# Patient Record
Sex: Male | Born: 1955 | Race: White | Hispanic: No | Marital: Married | Smoking: Current every day smoker
Health system: Southern US, Community
[De-identification: ages and names within clinical notes are randomized; demographics above are authoritative.]

## PROBLEM LIST (undated history)

## (undated) DIAGNOSIS — B1920 Unspecified viral hepatitis C without hepatic coma: Secondary | ICD-10-CM

## (undated) DIAGNOSIS — M545 Low back pain, unspecified: Secondary | ICD-10-CM

## (undated) DIAGNOSIS — K219 Gastro-esophageal reflux disease without esophagitis: Secondary | ICD-10-CM

## (undated) DIAGNOSIS — K769 Liver disease, unspecified: Principal | ICD-10-CM

## (undated) DIAGNOSIS — K7460 Unspecified cirrhosis of liver: Secondary | ICD-10-CM

## (undated) DIAGNOSIS — F419 Anxiety disorder, unspecified: Secondary | ICD-10-CM

## (undated) DIAGNOSIS — C220 Liver cell carcinoma: Secondary | ICD-10-CM

## (undated) DIAGNOSIS — I10 Essential (primary) hypertension: Secondary | ICD-10-CM

## (undated) DIAGNOSIS — D696 Thrombocytopenia, unspecified: Secondary | ICD-10-CM

## (undated) HISTORY — DX: Unspecified viral hepatitis C without hepatic coma: B19.20

## (undated) HISTORY — DX: Gastro-esophageal reflux disease without esophagitis: K21.9

## (undated) HISTORY — DX: Liver cell carcinoma: C22.0

## (undated) HISTORY — DX: Low back pain, unspecified: M54.50

## (undated) HISTORY — DX: Thrombocytopenia, unspecified: D69.6

## (undated) HISTORY — DX: Essential (primary) hypertension: I10

## (undated) HISTORY — DX: Liver disease, unspecified: K76.9

## (undated) HISTORY — DX: Anxiety disorder, unspecified: F41.9

## (undated) HISTORY — DX: Unspecified cirrhosis of liver: K74.60

## (undated) HISTORY — DX: Low back pain: M54.5

---

## 2002-02-15 ENCOUNTER — Emergency Department (HOSPITAL_COMMUNITY): Admission: EM | Admit: 2002-02-15 | Discharge: 2002-02-15 | Payer: Self-pay | Admitting: *Deleted

## 2003-10-30 ENCOUNTER — Emergency Department (HOSPITAL_COMMUNITY): Admission: EM | Admit: 2003-10-30 | Discharge: 2003-10-30 | Payer: Self-pay | Admitting: Emergency Medicine

## 2004-05-25 ENCOUNTER — Ambulatory Visit (HOSPITAL_COMMUNITY): Admission: RE | Admit: 2004-05-25 | Discharge: 2004-05-25 | Payer: Self-pay | Admitting: Family Medicine

## 2007-04-26 ENCOUNTER — Ambulatory Visit (HOSPITAL_COMMUNITY): Admission: RE | Admit: 2007-04-26 | Discharge: 2007-04-26 | Payer: Self-pay | Admitting: Family Medicine

## 2008-01-17 ENCOUNTER — Ambulatory Visit: Payer: Self-pay | Admitting: Gastroenterology

## 2011-05-17 ENCOUNTER — Other Ambulatory Visit (HOSPITAL_COMMUNITY): Payer: Self-pay | Admitting: Family Medicine

## 2011-05-17 DIAGNOSIS — G8929 Other chronic pain: Secondary | ICD-10-CM

## 2011-05-17 DIAGNOSIS — M545 Low back pain: Secondary | ICD-10-CM

## 2011-05-18 ENCOUNTER — Ambulatory Visit (HOSPITAL_COMMUNITY)
Admission: RE | Admit: 2011-05-18 | Discharge: 2011-05-18 | Disposition: A | Discharge status: Home / Self Care | Payer: Self-pay | Source: Ambulatory Visit | Attending: Family Medicine | Admitting: Family Medicine

## 2011-05-18 DIAGNOSIS — M5137 Other intervertebral disc degeneration, lumbosacral region: Secondary | ICD-10-CM | POA: Insufficient documentation

## 2011-05-18 DIAGNOSIS — M51379 Other intervertebral disc degeneration, lumbosacral region without mention of lumbar back pain or lower extremity pain: Secondary | ICD-10-CM | POA: Insufficient documentation

## 2011-05-18 DIAGNOSIS — M25559 Pain in unspecified hip: Secondary | ICD-10-CM | POA: Insufficient documentation

## 2011-05-18 DIAGNOSIS — M545 Low back pain, unspecified: Secondary | ICD-10-CM | POA: Insufficient documentation

## 2011-05-18 DIAGNOSIS — G8929 Other chronic pain: Secondary | ICD-10-CM

## 2011-05-18 DIAGNOSIS — M5126 Other intervertebral disc displacement, lumbar region: Secondary | ICD-10-CM | POA: Insufficient documentation

## 2011-05-30 ENCOUNTER — Other Ambulatory Visit (HOSPITAL_COMMUNITY): Payer: Self-pay | Admitting: Oncology

## 2011-05-30 ENCOUNTER — Ambulatory Visit (HOSPITAL_COMMUNITY)
Admission: RE | Admit: 2011-05-30 | Discharge: 2011-05-30 | Disposition: A | Discharge status: Home / Self Care | Payer: Self-pay | Source: Ambulatory Visit | Attending: Oncology | Admitting: Oncology

## 2011-05-30 ENCOUNTER — Encounter (HOSPITAL_COMMUNITY): Payer: Self-pay | Attending: Oncology | Admitting: Oncology

## 2011-05-30 DIAGNOSIS — K7689 Other specified diseases of liver: Secondary | ICD-10-CM | POA: Insufficient documentation

## 2011-05-30 DIAGNOSIS — K7460 Unspecified cirrhosis of liver: Secondary | ICD-10-CM

## 2011-05-30 DIAGNOSIS — D696 Thrombocytopenia, unspecified: Secondary | ICD-10-CM

## 2011-05-30 DIAGNOSIS — D72819 Decreased white blood cell count, unspecified: Secondary | ICD-10-CM

## 2011-05-30 DIAGNOSIS — B1920 Unspecified viral hepatitis C without hepatic coma: Secondary | ICD-10-CM

## 2011-05-30 DIAGNOSIS — R5381 Other malaise: Secondary | ICD-10-CM | POA: Insufficient documentation

## 2011-05-30 DIAGNOSIS — R161 Splenomegaly, not elsewhere classified: Secondary | ICD-10-CM | POA: Insufficient documentation

## 2011-05-30 DIAGNOSIS — R599 Enlarged lymph nodes, unspecified: Secondary | ICD-10-CM | POA: Insufficient documentation

## 2011-05-30 DIAGNOSIS — R5383 Other fatigue: Secondary | ICD-10-CM | POA: Insufficient documentation

## 2011-05-30 LAB — COMPREHENSIVE METABOLIC PANEL
ALT: 63 U/L — ABNORMAL HIGH (ref 0–53)
AST: 58 U/L — ABNORMAL HIGH (ref 0–37)
Albumin: 4.3 g/dL (ref 3.5–5.2)
Alkaline Phosphatase: 105 U/L (ref 39–117)
BUN: 9 mg/dL (ref 6–23)
CO2: 34 mEq/L — ABNORMAL HIGH (ref 19–32)
Calcium: 10.1 mg/dL (ref 8.4–10.5)
Chloride: 101 mEq/L (ref 96–112)
Creatinine, Ser: 0.59 mg/dL (ref 0.4–1.5)
GFR calc Af Amer: >60 mL/min (ref >60)
GFR calc non Af Amer: >60 mL/min (ref >60)
Glucose, Bld: 104 mg/dL — ABNORMAL HIGH (ref 70–99)
Potassium: 4.6 mEq/L (ref 3.5–5.1)
Sodium: 140 mEq/L (ref 135–145)
Total Bilirubin: 0.9 mg/dL (ref 0.3–1.2)
Total Protein: 7.4 g/dL (ref 6.0–8.3)

## 2011-05-30 LAB — CBC
HCT: 42.6 % (ref 39.0–52.0)
Hemoglobin: 15.3 g/dL (ref 13.0–17.0)
MCH: 33.2 pg (ref 26.0–34.0)
MCHC: 35.9 g/dL (ref 30.0–36.0)
MCV: 92.4 fL (ref 78.0–100.0)
Platelets: 43 10*3/uL — ABNORMAL LOW (ref 150–400)
RBC: 4.61 MIL/uL (ref 4.22–5.81)
RDW: 13.7 % (ref 11.5–15.5)
WBC: 3.6 10*3/uL — ABNORMAL LOW (ref 4.0–10.5)

## 2011-05-30 LAB — DIFFERENTIAL
Basophils Absolute: 0 10*3/uL (ref 0.0–0.1)
Basophils Relative: 0 % (ref 0–1)
Eosinophils Absolute: 0 10*3/uL (ref 0.0–0.7)
Eosinophils Relative: 1 % (ref 0–5)
Lymphocytes Relative: 14 % (ref 12–46)
Lymphs Abs: 0.5 10*3/uL — ABNORMAL LOW (ref 0.7–4.0)
Monocytes Absolute: 0.3 10*3/uL (ref 0.1–1.0)
Monocytes Relative: 8 % (ref 3–12)
Neutro Abs: 2.8 10*3/uL (ref 1.7–7.7)
Neutrophils Relative %: 77 % (ref 43–77)

## 2011-05-30 LAB — FOLATE: Folate: >20 ng/mL

## 2011-05-30 LAB — HIV ANTIBODY (ROUTINE TESTING W REFLEX): HIV: NONREACTIVE

## 2011-05-30 LAB — VITAMIN B12: Vitamin B-12: 1064 pg/mL — ABNORMAL HIGH (ref 211–911)

## 2011-05-30 LAB — LACTATE DEHYDROGENASE: LDH: 162 U/L (ref 94–250)

## 2011-05-30 MED ORDER — IOHEXOL 300 MG/ML  SOLN
50.0000 mL | Freq: Once | INTRAMUSCULAR | Status: AC | PRN
  Administered 2011-05-30: 50 mL via INTRAVENOUS

## 2011-05-30 MED ORDER — IOHEXOL 300 MG/ML  SOLN
100.0000 mL | Freq: Once | INTRAMUSCULAR | Status: AC | PRN
  Administered 2011-05-30: 100 mL via INTRAVENOUS

## 2011-05-31 LAB — HEPATITIS PANEL, ACUTE
HCV Ab: REACTIVE — AB
Hep A IgM: NEGATIVE
Hep B C IgM: NEGATIVE
Hepatitis B Surface Ag: NEGATIVE

## 2011-06-06 ENCOUNTER — Other Ambulatory Visit (HOSPITAL_COMMUNITY): Payer: Self-pay | Admitting: Oncology

## 2011-06-06 DIAGNOSIS — K769 Liver disease, unspecified: Secondary | ICD-10-CM

## 2011-06-08 ENCOUNTER — Encounter (INDEPENDENT_AMBULATORY_CARE_PROVIDER_SITE_OTHER): Payer: Self-pay | Admitting: Vascular Surgery

## 2011-06-08 ENCOUNTER — Encounter (INDEPENDENT_AMBULATORY_CARE_PROVIDER_SITE_OTHER): Payer: Self-pay

## 2011-06-08 DIAGNOSIS — M79609 Pain in unspecified limb: Secondary | ICD-10-CM

## 2011-06-08 DIAGNOSIS — I70219 Atherosclerosis of native arteries of extremities with intermittent claudication, unspecified extremity: Secondary | ICD-10-CM

## 2011-06-09 NOTE — Consult Note (Signed)
VASCULAR SURGERY CONSULTATION  Andrew White, Andrew White DOB:  Dec 13, 1956                                       06/08/2011 CHART#:[id number]  I saw this patient in the  office today concerning his bilateral lower extremity pain.  He was referred by Dr. Phillips Odor.  This a pleasant, 55- year-old gentleman who noted the gradual onset of bilateral leg pain approximately a year ago.  He experiences pain in both calves which is brought on by ambulation and relieved with rest.  This occurs at approximately 50 yards.  Over the last year, his symptoms have remained relatively stable.  There are no other aggravating or alleviating factors.  He does get some cramps at night but has had no rest pain.  He had no history of nonhealing wounds.  PAST MEDICAL HISTORY:  Significant for hypertension.  He denies any history of diabetes, hypercholesterolemia, history of previous myocardial infarction, or history of congestive heart failure.  He does have history of hepatitis C and also lumbar disk disease and is followed by Dr. Jeral Fruit with L4-5 disease.  SOCIAL HISTORY:  He is married.  He has 2 children.  He smokes a pack per day of cigarettes and has been smoking for 35-40 years.  He does not drink alcohol on a regular basis.  FAMILY HISTORY:  Has a brother who had PTCA at a young age.  He is unaware of any other history of premature cardiovascular disease.  REVIEW OF SYSTEMS:  GENERAL:  He has had some weight loss, he is 5 feet 11 inches, 136 pounds.  He had some loss of appetite.  CARDIOVASCULAR: He had no chest pain, chest pressure, palpitations or arrhythmias.  He does admit to dyspnea on exertion.  He had no history of stroke or TIA. He had no history of DVT or phlebitis. NEUROLOGIC:  He has occasional headaches. ENT:  He has had some gradual loss of hearing. MUSCULOSKELETAL:  He has had joint pain and muscle pain. PSYCHIATRIC:  He has had depression and anxiety. GI, PULMONARY, GU,  INTEGUMENTARY:  Unremarkable and documented on the medical history of his chart. HEMATOLOGIC:  He does have a history of thrombocytopenia.  PHYSICAL EXAMINATION:  This is a pleasant 55 year old gentleman who appears his stated age.  Blood pressure is 109/69, heart rate is 68, saturation 98%.  HEENT:  Unremarkable.  Lungs:  Clear bilaterally to auscultation without rales, rhonchi or wheezing.  Cardiovascular:  I do not detect any carotid bruits.  He has a regular rate and rhythm.  He has diminished palpable femoral pulses bilaterally.  I cannot palpate popliteal or pedal pulses on either side.  He has acrocyanosis bilaterally.  He does not have any significant lower extremity swelling. Abdomen:  Soft and nontender with normal pitched bowel sounds.  I do not appreciate an aneurysm.  Musculoskeletal:  There are no major deformities.  Neurologic:  He has no focal weakness or paresthesias. Skin:  There are no ulcers or rashes.  I have independently interpreted his arterial Doppler study in our office today which shows monophasic Doppler signals in the right foot with biphasic Doppler signals on the left.  He has an ankle brachial index of 75% on the right and 77% on the left.  Based on his exam, it is difficult to determine if he may have some proximal aortoiliac disease or if his  disease is more infrainguinal. The femoral pulses do feel diminished.  However, given that his ABIs appear reasonable,  I suspect that he does not have multilevel disease. Regardless  currently his symptoms are quite tolerable and have been stable over the last year.  I have explained that typically we would not recommend arteriography unless he developed disabling claudication, rest pain, or nonhealing ulcer.  We have had a long discussion about the importance of tobacco cessation and I have given the number for [facility] tobacco cessation program.  I discussed conservative treatment which includes tobacco  cessation and a structured walking program.  I have also reviewed his records that were sent with him and he apparently is scheduled for a liver biopsy in the near future.  He does have a history of chronic thrombocytopenia.  I will see him back in 6 months and I have ordered follow-up ABIs for that time.  He knows to call sooner if he has problems.    Di Kindle. Edilia Bo, M.D. Electronically Signed CSD/MEDQ  D:  06/08/2011  T:  06/09/2011  Job:  [id number]  cc:   Corrie Mckusick, M.D. Hilda Lias, M.D.

## 2011-06-13 ENCOUNTER — Encounter (HOSPITAL_COMMUNITY): Payer: Self-pay | Admitting: Oncology

## 2011-06-13 ENCOUNTER — Other Ambulatory Visit (HOSPITAL_COMMUNITY): Payer: Self-pay | Admitting: Oncology

## 2011-06-13 DIAGNOSIS — K7689 Other specified diseases of liver: Secondary | ICD-10-CM

## 2011-06-13 DIAGNOSIS — D696 Thrombocytopenia, unspecified: Secondary | ICD-10-CM

## 2011-06-13 DIAGNOSIS — D72819 Decreased white blood cell count, unspecified: Secondary | ICD-10-CM

## 2011-06-13 LAB — COMPREHENSIVE METABOLIC PANEL
Albumin: 4.1 g/dL (ref 3.5–5.2)
BUN: 9 mg/dL (ref 6–23)
Calcium: 9.8 mg/dL (ref 8.4–10.5)
Chloride: 105 mEq/L (ref 96–112)
Creatinine, Ser: 0.63 mg/dL (ref 0.50–1.35)
GFR calc non Af Amer: >60 mL/min (ref >60)
Total Bilirubin: 1.3 mg/dL — ABNORMAL HIGH (ref 0.3–1.2)

## 2011-06-13 LAB — DIFFERENTIAL
Basophils Absolute: 0 10*3/uL (ref 0.0–0.1)
Basophils Relative: 0 % (ref 0–1)
Eosinophils Absolute: 0 10*3/uL (ref 0.0–0.7)
Eosinophils Relative: 1 % (ref 0–5)
Monocytes Absolute: 0.2 10*3/uL (ref 0.1–1.0)
Monocytes Relative: 7 % (ref 3–12)
Neutro Abs: 2.7 10*3/uL (ref 1.7–7.7)

## 2011-06-13 LAB — CBC
HCT: 41 % (ref 39.0–52.0)
Hemoglobin: 15.1 g/dL (ref 13.0–17.0)
MCH: 33.9 pg (ref 26.0–34.0)
MCHC: 36.8 g/dL — ABNORMAL HIGH (ref 30.0–36.0)
MCV: 92.1 fL (ref 78.0–100.0)
RDW: 13.8 % (ref 11.5–15.5)

## 2011-06-14 LAB — AFP TUMOR MARKER: AFP-Tumor Marker: 3 ng/mL (ref 0.0–8.0)

## 2011-06-17 DIAGNOSIS — I70219 Atherosclerosis of native arteries of extremities with intermittent claudication, unspecified extremity: Secondary | ICD-10-CM

## 2011-06-21 ENCOUNTER — Ambulatory Visit (HOSPITAL_COMMUNITY)
Admission: RE | Admit: 2011-06-21 | Discharge: 2011-06-21 | Disposition: A | Discharge status: Home / Self Care | Payer: Self-pay | Source: Ambulatory Visit | Attending: Oncology | Admitting: Oncology

## 2011-06-21 ENCOUNTER — Other Ambulatory Visit (HOSPITAL_COMMUNITY): Payer: Self-pay | Admitting: Oncology

## 2011-06-21 ENCOUNTER — Other Ambulatory Visit (HOSPITAL_COMMUNITY): Payer: Self-pay

## 2011-06-21 DIAGNOSIS — K769 Liver disease, unspecified: Secondary | ICD-10-CM

## 2011-06-21 DIAGNOSIS — Z5309 Procedure and treatment not carried out because of other contraindication: Secondary | ICD-10-CM | POA: Insufficient documentation

## 2011-06-21 DIAGNOSIS — D696 Thrombocytopenia, unspecified: Secondary | ICD-10-CM | POA: Insufficient documentation

## 2011-06-21 DIAGNOSIS — Z01812 Encounter for preprocedural laboratory examination: Secondary | ICD-10-CM | POA: Insufficient documentation

## 2011-06-21 DIAGNOSIS — K7689 Other specified diseases of liver: Secondary | ICD-10-CM | POA: Insufficient documentation

## 2011-06-21 LAB — CBC
MCH: 34.1 pg — ABNORMAL HIGH (ref 26.0–34.0)
MCV: 90.2 fL (ref 78.0–100.0)
Platelets: 31 10*3/uL — ABNORMAL LOW (ref 150–400)
RDW: 13.8 % (ref 11.5–15.5)

## 2011-06-21 LAB — PROTIME-INR: Prothrombin Time: 17.2 seconds — ABNORMAL HIGH (ref 11.6–15.2)

## 2011-06-22 ENCOUNTER — Other Ambulatory Visit (HOSPITAL_COMMUNITY): Payer: Self-pay | Admitting: Oncology

## 2011-06-22 DIAGNOSIS — K769 Liver disease, unspecified: Secondary | ICD-10-CM

## 2011-06-24 ENCOUNTER — Other Ambulatory Visit (HOSPITAL_COMMUNITY): Payer: Self-pay | Admitting: *Deleted

## 2011-06-24 ENCOUNTER — Ambulatory Visit (HOSPITAL_COMMUNITY)
Admission: RE | Admit: 2011-06-24 | Discharge: 2011-06-24 | Disposition: A | Discharge status: Home / Self Care | Payer: Self-pay | Source: Ambulatory Visit | Attending: Diagnostic Radiology | Admitting: Diagnostic Radiology

## 2011-06-24 DIAGNOSIS — M25559 Pain in unspecified hip: Secondary | ICD-10-CM | POA: Insufficient documentation

## 2011-06-24 DIAGNOSIS — M545 Low back pain, unspecified: Secondary | ICD-10-CM | POA: Insufficient documentation

## 2011-06-24 DIAGNOSIS — M549 Dorsalgia, unspecified: Secondary | ICD-10-CM

## 2011-06-24 DIAGNOSIS — M546 Pain in thoracic spine: Secondary | ICD-10-CM | POA: Insufficient documentation

## 2011-07-02 ENCOUNTER — Other Ambulatory Visit (HOSPITAL_COMMUNITY): Payer: Self-pay | Admitting: Oncology

## 2011-07-02 ENCOUNTER — Encounter (HOSPITAL_COMMUNITY): Payer: Self-pay | Admitting: Oncology

## 2011-07-02 DIAGNOSIS — K769 Liver disease, unspecified: Secondary | ICD-10-CM | POA: Insufficient documentation

## 2011-07-02 DIAGNOSIS — K7460 Unspecified cirrhosis of liver: Secondary | ICD-10-CM | POA: Insufficient documentation

## 2011-07-02 DIAGNOSIS — B1920 Unspecified viral hepatitis C without hepatic coma: Secondary | ICD-10-CM

## 2011-07-02 HISTORY — DX: Liver disease, unspecified: K76.9

## 2011-07-02 HISTORY — DX: Unspecified viral hepatitis C without hepatic coma: B19.20

## 2011-07-02 HISTORY — DX: Unspecified cirrhosis of liver: K74.60

## 2011-07-04 ENCOUNTER — Other Ambulatory Visit: Payer: Self-pay | Admitting: Interventional Radiology

## 2011-07-04 ENCOUNTER — Ambulatory Visit (HOSPITAL_COMMUNITY)
Admission: RE | Admit: 2011-07-04 | Discharge: 2011-07-04 | Disposition: A | Discharge status: Home / Self Care | Payer: Self-pay | Source: Ambulatory Visit | Attending: Oncology | Admitting: Oncology

## 2011-07-04 DIAGNOSIS — K7689 Other specified diseases of liver: Secondary | ICD-10-CM | POA: Insufficient documentation

## 2011-07-04 DIAGNOSIS — K769 Liver disease, unspecified: Secondary | ICD-10-CM

## 2011-07-04 DIAGNOSIS — K7460 Unspecified cirrhosis of liver: Secondary | ICD-10-CM | POA: Insufficient documentation

## 2011-07-04 HISTORY — PX: LIVER BIOPSY: SHX301

## 2011-07-04 LAB — CBC
HCT: 37.6 % — ABNORMAL LOW (ref 39.0–52.0)
Hemoglobin: 14.1 g/dL (ref 13.0–17.0)
MCV: 91.9 fL (ref 78.0–100.0)
RDW: 14.6 % (ref 11.5–15.5)
WBC: 2.4 10*3/uL — ABNORMAL LOW (ref 4.0–10.5)

## 2011-07-04 LAB — TYPE AND SCREEN

## 2011-07-05 ENCOUNTER — Ambulatory Visit (HOSPITAL_COMMUNITY): Payer: Self-pay | Admitting: Oncology

## 2011-07-05 LAB — PREPARE PLATELET PHERESIS
Unit division: 0
Unit division: 0

## 2011-07-08 ENCOUNTER — Other Ambulatory Visit (HOSPITAL_COMMUNITY): Payer: Self-pay | Admitting: Oncology

## 2011-07-08 ENCOUNTER — Encounter (HOSPITAL_COMMUNITY): Payer: Self-pay | Attending: Oncology | Admitting: Oncology

## 2011-07-08 ENCOUNTER — Encounter (HOSPITAL_COMMUNITY): Payer: Self-pay | Admitting: Oncology

## 2011-07-08 DIAGNOSIS — B1920 Unspecified viral hepatitis C without hepatic coma: Secondary | ICD-10-CM

## 2011-07-08 DIAGNOSIS — C228 Malignant neoplasm of liver, primary, unspecified as to type: Secondary | ICD-10-CM

## 2011-07-08 DIAGNOSIS — C220 Liver cell carcinoma: Secondary | ICD-10-CM | POA: Insufficient documentation

## 2011-07-08 DIAGNOSIS — K7460 Unspecified cirrhosis of liver: Secondary | ICD-10-CM

## 2011-07-08 HISTORY — DX: Liver cell carcinoma: C22.0

## 2011-07-08 LAB — DIFFERENTIAL: Monocytes Absolute: 0.2 10*3/uL (ref 0.1–1.0)

## 2011-07-08 LAB — HEPATIC FUNCTION PANEL
ALT: 52 U/L (ref 0–53)
AST: 47 U/L — ABNORMAL HIGH (ref 0–37)
Albumin: 4.1 g/dL (ref 3.5–5.2)
Alkaline Phosphatase: 89 U/L (ref 39–117)
Total Bilirubin: 0.6 mg/dL (ref 0.3–1.2)

## 2011-07-08 LAB — CBC
HCT: 41.7 % (ref 39.0–52.0)
MCH: 33.6 pg (ref 26.0–34.0)
MCHC: 35.5 g/dL (ref 30.0–36.0)
MCV: 94.6 fL (ref 78.0–100.0)
Platelets: 46 10*3/uL — ABNORMAL LOW (ref 150–400)
RDW: 14.7 % (ref 11.5–15.5)
WBC: 2.7 10*3/uL — ABNORMAL LOW (ref 4.0–10.5)

## 2011-07-08 NOTE — Progress Notes (Signed)
Andrew Ruths, MD [address] [place] [address]  1. Hepatocellular carcinoma  CBC, Differential, Ambulatory referral to Radiation Oncology, Hepatic function panel  2. Hepatitis C  Hepatitis c vrs RNA detect by PCR-qual, CBC, Differential, Hepatic function panel  3. Cirrhosis of liver  CBC, Differential, Hepatic function panel    INTERVAL HISTORY: Andrew White 55 y.o. male returns for followup following an Interventional Radiology biopsy of a liver lesion discovered on CT scan of Abdomen.  The CT revealed two hepatic lesions of the inferior right lobe measuring 2.7 x 2.4 x 2.5 cm medially in inferior right lobe and 2.3 x 2.1 x 2.0 cm laterally in the inferior right lobe.   He underwent this biopsy on 07/04/11.  Biopsy reveals hepatocellular carcinoma, well differentiated.  This of course was surprising and unfortunate news for the patient and his wife.    We spent some time with the patient explaining his diagnosis and his treatment options.  Due to his Hep C and Cirrhosis, the patient is likely not a surgical candidate.  Dr. Mariel Sleet spoke to a radiation oncologist (Dr. Mitzi Hansen).  The patient's case will be discussed at the GI tumor board.  He will be seen by Radiation Oncology in the near future.  The patient was asked to the call the [facility] on Tuesday if he has not heard about a consultation appointment.  The patient denies any complaints today other than being nervous about the results of the biopsy.  He denies any recent weight loss.  He continues to decrease his tobacco dependency and is smoking 1/2 ppd (compared to 1 1/2 ppd).    Past Medical History  Diagnosis Date  . Liver lesion, right lobe 07/02/2011  . Hepatitis C 07/02/2011  . Cirrhosis of liver 07/02/2011  . Low back pain     radiates to right side  . Thrombocytopenia   . Hepatocellular carcinoma 07/08/2011    has Liver lesion, right lobe; Hepatitis C; Cirrhosis of liver; and Hepatocellular  carcinoma on his problem list.      has no known allergies.  Andrew White does not currently have medications on file.  Past Surgical History  Procedure Date  . Liver biopsy 07/04/11    Done at [facility]    Denies any headaches, dizziness, double vision, fevers, chills, night sweats, nausea, vomiting, diarrhea, constipation, chest pain, heart palpitations, shortness of breath, blood in stool, black tarry stool, urinary pain, urinary burning, urinary frequency, hematuria.   PHYSICAL EXAMINATION  Filed Vitals:   07/08/11 0919  BP: 97/66  Pulse: 79  Temp: 97.7 F (36.5 C)    GENERAL:alert, no distress and skinny SKIN: skin color, texture, turgor are normal, no rashes or significant lesions HEAD: Normocephalic, No masses, lesions, tenderness or abnormalities EYES: normal EARS: External ears normal NECK: trachea midline LYMPH:  not examined BREAST:not examined LUNGS: clear to auscultation and percussion, decreased breath sounds HEART: regular rate & rhythm, no murmurs, no gallops, S1 normal and S2 normal ABDOMEN:abdomen soft, non-tender and normal bowel sounds BACK: Back symmetric, no curvature., No CVA tenderness EXTREMITIES:less then 2 second capillary refill, no joint deformities, effusion, or inflammation, no edema, no skin discoloration  NEURO: alert & oriented x 3 with fluent speech, no focal motor/sensory deficits, gait normal   LABORATORY DATA: Lab Results  Component Value Date   WBC 2.7* 07/08/2011   HGB 14.8 07/08/2011   HCT 41.7 07/08/2011   MCV 94.6 07/08/2011   PLT 46* 07/08/2011  Chemistry      Component Value Date/Time   NA 142 06/13/2011 1040   K 4.4 06/13/2011 1040   CL 105 06/13/2011 1040   CO2 31 06/13/2011 1040   BUN 9 06/13/2011 1040   CREATININE 0.63 06/13/2011 1040      Component Value Date/Time   CALCIUM 9.8 06/13/2011 1040   ALKPHOS 89 07/08/2011 1040   AST 47* 07/08/2011 1040   ALT 52 07/08/2011 1040   BILITOT 0.6 07/08/2011 1040         PENDING LABS: Hep C RNA Viral Titier, CBC diff, Liver Panel   PATHOLOGY:  Hepatocellular carcinoma, well differentiated (biopsy of liver lesion)    ASSESSMENT: 1. Hepatocellular carcinoma, well-differentiated (liver biopsy) 2. Hepatitis C 3. Cirrhosis of liver 4. Tobacco dependency 5. Thrombocytopenia   PLAN:  1. Liver Panel today 2. CBC diff today 3. Hepatitis C RNA viral titer 4. Referral to radiation oncology 5. Continue working on tobacco cessation.  I encouraged the patient to continue decreasing smoking dependency to 1/4 ppd. 6. Patient will return to the clinic in one month's time for follow-up.   All questions were answered. The patient knows to call the clinic with any problems, questions or concerns. We can certainly see the patient much sooner if necessary.  The patient and plan discussed with Glenford Peers, MD and he is in agreement with the aforementioned.  I spent 25 minutes counseling the patient face to face. The total time spent in the appointment was 40 minutes.  Haelie Clapp

## 2011-07-08 NOTE — Patient Instructions (Signed)
[facility]  Discharge Instructions  RECOMMENDATIONS MADE BY THE CONSULTANT AND ANY TEST RESULTS WILL BE SENT TO YOUR REFERRING DOCTOR.   EXAM FINDINGS BY MD TODAY AND SIGNS AND SYMPTOMS TO REPORT TO CLINIC OR PRIMARY GN:FAOZ per Dr Mariel Sleet and Jenita Seashore PA  MEDICATIONS PRESCRIBED: None { INSTRUCTIONS GIVEN AND DISCUSSED: Labs today SPECIAL INSTRUCTIONS/FOLLOW-UP: We will make referral to Dr. Mitzi Hansen. Call us if you do not hear from Radiation appt by Tuesday of next week. [phone] I acknowledge that I have been informed and understand all the instructions given to me and received a copy. I do not have any more questions at this time, but understand that I may call the Specialty Clinic at [facility] at [phone] during business hours should I have any further questions or need assistance in obtaining follow-up care.    __________________________________________  _____________  __________ Signature of Patient or Authorized Representative            Date                   Time    __________________________________________ Nurse's Signature

## 2011-07-12 ENCOUNTER — Telehealth (HOSPITAL_COMMUNITY): Payer: Self-pay | Admitting: *Deleted

## 2011-07-12 NOTE — Telephone Encounter (Signed)
Thank you :)

## 2011-07-12 NOTE — Telephone Encounter (Signed)
The lab cancelled out the Hepatitis C Vrs RNA by PCR-Qual due to insufficient quanity. I will call him to r/s

## 2011-07-13 ENCOUNTER — Ambulatory Visit (HOSPITAL_COMMUNITY): Payer: Self-pay

## 2011-07-14 ENCOUNTER — Encounter (HOSPITAL_BASED_OUTPATIENT_CLINIC_OR_DEPARTMENT_OTHER): Payer: Self-pay

## 2011-07-14 ENCOUNTER — Other Ambulatory Visit (HOSPITAL_COMMUNITY): Payer: Self-pay | Admitting: Oncology

## 2011-07-14 DIAGNOSIS — B1920 Unspecified viral hepatitis C without hepatic coma: Secondary | ICD-10-CM

## 2011-07-14 DIAGNOSIS — C228 Malignant neoplasm of liver, primary, unspecified as to type: Secondary | ICD-10-CM

## 2011-07-14 NOTE — Progress Notes (Signed)
Labs drawn today for HCV by PCR

## 2011-07-20 ENCOUNTER — Ambulatory Visit
Admission: RE | Admit: 2011-07-20 | Discharge: 2011-07-20 | Disposition: A | Discharge status: Home / Self Care | Payer: Self-pay | Source: Ambulatory Visit | Attending: Radiation Oncology | Admitting: Radiation Oncology

## 2011-07-20 DIAGNOSIS — K7460 Unspecified cirrhosis of liver: Secondary | ICD-10-CM | POA: Insufficient documentation

## 2011-07-20 DIAGNOSIS — Z8619 Personal history of other infectious and parasitic diseases: Secondary | ICD-10-CM | POA: Insufficient documentation

## 2011-07-20 DIAGNOSIS — Z79899 Other long term (current) drug therapy: Secondary | ICD-10-CM | POA: Insufficient documentation

## 2011-07-20 DIAGNOSIS — C228 Malignant neoplasm of liver, primary, unspecified as to type: Secondary | ICD-10-CM | POA: Insufficient documentation

## 2011-07-22 ENCOUNTER — Other Ambulatory Visit: Payer: Self-pay | Admitting: Radiation Oncology

## 2011-07-22 DIAGNOSIS — C220 Liver cell carcinoma: Secondary | ICD-10-CM

## 2011-07-25 ENCOUNTER — Ambulatory Visit (INDEPENDENT_AMBULATORY_CARE_PROVIDER_SITE_OTHER): Payer: Self-pay | Admitting: Internal Medicine

## 2011-07-25 ENCOUNTER — Encounter (INDEPENDENT_AMBULATORY_CARE_PROVIDER_SITE_OTHER): Payer: Self-pay | Admitting: Internal Medicine

## 2011-07-25 VITALS — BP 84/50 | HR 84 | Temp 98.0°F | Ht 70.0 in | Wt 130.0 lb

## 2011-07-25 DIAGNOSIS — C220 Liver cell carcinoma: Secondary | ICD-10-CM

## 2011-07-25 DIAGNOSIS — C228 Malignant neoplasm of liver, primary, unspecified as to type: Secondary | ICD-10-CM

## 2011-07-25 DIAGNOSIS — B1920 Unspecified viral hepatitis C without hepatic coma: Secondary | ICD-10-CM

## 2011-07-25 NOTE — Progress Notes (Signed)
Subjective:     Patient ID: Andrew White, male   DOB: Aug 25, 1956, 55 y.o.   MRN: [id number]  HPI Referred by Dr. Mariel Sleet for Hepatitis C/hepatic carcinoma.  He was diagnosed with Hepatitis C 5 yrs ago by the [place]  He was tested due to low platelets  His risk factors for Hepatitis C as far as he know is  tatoos. He had tatoos yrs ago (home tatoos).  No multiple sexual partners. No IV drug use. He has received Hepatitis B vaccines series.   He was not treated for Hepatitis C.  His wife says he basically fell thru the cracks as far as being referred for treatment  He underwent a liver biopsy 07/04/2011 which  revealed hepatolcelluar carcinoma, well differentiated.  He originally saw Dr. Regino Schultze and his WBC ct were low and platelets were low. MRI of the liver is scheduled in the AM.  07/13/2011 AFP 3.0 07/08/2011: total bili 0.6, Direct 0.2, Indirect 0.4, ALP 89, AST 47, ALT 52, Total protein 7.4, Albumin 4.1 WBC low at  2.7, H and H 14.8 and 41.7, MCV 94.6, Platelet low at 46. 06/13/2011: Sodium 142, K 4.4, Chloride 105, C02 31, Glucose 105, BUN 9, Creatinine 0.63, Calcium 9.8, total protein 7.4, Albumin 4.1, AST 46, ALT 50, ALP 74 06/04/2011 PT/INR 15.8 and 1.23 05/30/2011 Folate greater than 20, Vitamin B12 1064  05/30/2011:Hepatitis B Surface Antigen negative. Hepatitis B Core antibody (IgM) negative. Hepatitis A Antibody (IgM) Negative. Hepatitis C antibody reactive, HIV non reactive  06/21/2011 US abdomen: Round lesion along the inferior rt hepatic lobe that is accessible for percutaneous biopsy.  CT abdomen and pelvic with CM 05/30/2011: Cirrhotic appearing liver containing two hypervascular foci in rt lobe with enhancement pattern highly suspicious for multi foci hepatocellular carcinoma.  Significant splenomegaly with several nonspecific low attenuation foci. Perigastric  Collaterals. Extensive atherosclerotic disease. MRI pelvis without contrast 05/18/2011: Small but abnormal amount of  pelvic ascites, etiology uncertain. Sigmoid diverticulosis without active diverticulitis.  Appetite is good . No weight loss. No abdominal pain.  He does have chronic back pain.  No jaundice. BM x 1-2 a day.  No rectal bleeding or melena.  Energy level is good. He denies fever or chills. Review of Systems   See hpi      Objective:   Physical ExamAlert and oriented. Skin warm and dry. Oral mucosa is moist.  Upper dentures and natural lower in fair condition. Sclera anicteric, conjunctivae is pink. Thyroid not enlarged. No cervical lymphadenopathy. Lungs clear. Heart regular rate and rhythm.  Abdomen is soft. Bowel sounds are positive. No hepatomegaly.  He does have splenomegaly. No abdominal masses felt. No tenderness.  No edema to lower extremities. Patient is alert and oriented.    Meld  Score: 8 Assessment:    Hepatitis C and hepatic carcinoma.      Plan:   Further recommendations once we have the MRI report that is scheduled for tomorrow.  Dr. Karilyn Cota was present in the room. Izek may possible be referred for liver transplant pending MRI.

## 2011-07-25 NOTE — Patient Instructions (Signed)
Further recommendations once we have the MRI scheduled for tomorrow.

## 2011-07-26 ENCOUNTER — Ambulatory Visit (HOSPITAL_COMMUNITY)
Admission: RE | Admit: 2011-07-26 | Discharge: 2011-07-26 | Disposition: A | Discharge status: Home / Self Care | Payer: Self-pay | Source: Ambulatory Visit | Attending: Radiation Oncology | Admitting: Radiation Oncology

## 2011-07-26 ENCOUNTER — Encounter (INDEPENDENT_AMBULATORY_CARE_PROVIDER_SITE_OTHER): Payer: Self-pay | Admitting: Internal Medicine

## 2011-07-26 DIAGNOSIS — C228 Malignant neoplasm of liver, primary, unspecified as to type: Secondary | ICD-10-CM | POA: Insufficient documentation

## 2011-07-26 DIAGNOSIS — C220 Liver cell carcinoma: Secondary | ICD-10-CM

## 2011-07-26 MED ORDER — GADOBENATE DIMEGLUMINE 529 MG/ML IV SOLN
12.0000 mL | Freq: Once | INTRAVENOUS | Status: AC | PRN
  Administered 2011-07-26: 12 mL via INTRAVENOUS

## 2011-08-08 ENCOUNTER — Ambulatory Visit (HOSPITAL_COMMUNITY): Payer: Self-pay | Admitting: Oncology

## 2011-08-24 ENCOUNTER — Encounter (INDEPENDENT_AMBULATORY_CARE_PROVIDER_SITE_OTHER): Payer: Self-pay | Admitting: General Surgery

## 2011-08-25 ENCOUNTER — Encounter (INDEPENDENT_AMBULATORY_CARE_PROVIDER_SITE_OTHER): Payer: Self-pay | Admitting: General Surgery

## 2011-08-25 ENCOUNTER — Ambulatory Visit (INDEPENDENT_AMBULATORY_CARE_PROVIDER_SITE_OTHER): Payer: Self-pay | Admitting: General Surgery

## 2011-08-25 VITALS — BP 108/76 | HR 80 | Temp 97.1°F | Ht 71.0 in | Wt 130.8 lb

## 2011-08-25 DIAGNOSIS — C228 Malignant neoplasm of liver, primary, unspecified as to type: Secondary | ICD-10-CM

## 2011-08-25 DIAGNOSIS — C220 Liver cell carcinoma: Secondary | ICD-10-CM

## 2011-08-25 NOTE — Assessment & Plan Note (Addendum)
Plan laparoscopic assisted microwave/RF ablation.   Will need to coordinate with Interventional radiology. Will need platelets post op. Discussed with patient risks of bleeding, infection, damage to adjacent structures, liver failure, pain, and fatigue.  Pt understands and wishes to proceed.

## 2011-08-25 NOTE — Progress Notes (Signed)
Chief Complaint  Patient presents with  . Other    Hepato cellular cancer    HISTORY: Pt has a history of cirrhosis and hepatitis C times 4 years.  He had been undergoing workup for back pain, and one of his scans revealed a few liver lesions.  Subsequent CT and MRI demonstrate two lesions in the inferior R hepatic lobe.  He has significant cirrhosis, and required platelet transfusion for liver biopsy.  He also has splenomegaly.  He does not have ascites or encephalopathy.  He denies abdominal pain, weight loss, or difficulty eating.  He is on disability for his back pain.    Past Medical History  Diagnosis Date  . Liver lesion, right lobe 07/02/2011  . Hepatitis C 07/02/2011  . Cirrhosis of liver 07/02/2011  . Low back pain     radiates to right side  . Thrombocytopenia   . Hepatocellular carcinoma 07/08/2011  . Hypertension     per past medical records    Past Surgical History  Procedure Date  . Liver biopsy 07/04/11    Done at [facility]    Current Outpatient Prescriptions  Medication Sig Dispense Refill  . ALPRAZolam (XANAX) 0.5 MG tablet Take 0.5 mg by mouth 3 (three) times daily as needed.        Marland Kitchen lisinopril (PRINIVIL,ZESTRIL) 10 MG tablet Take 10 mg by mouth daily.        Marland Kitchen oxycodone (OXY-IR) 5 MG capsule Take 5 mg by mouth every 6 (six) hours as needed.        Marland Kitchen aspirin 81 MG tablet Take 81 mg by mouth daily.        . TraMADol HCl (ULTRAM PO) Take by mouth. Confirm dosage and frequency with patient.          No Known Allergies   Family History  Problem Relation Age of Onset  . Cancer Father     lung     History   Social History  . Marital Status: Married    Spouse Name: N/A    Number of Children: N/A  . Years of Education: N/A   Social History Main Topics  . Smoking status: Current Everyday Smoker -- 0.5 packs/day    Types: Cigarettes  . Smokeless tobacco: None   Comment: 1/2 pack a day x 40 yrs.  . Alcohol Use: No     Quit x 4 yrs after drinking12-18  pack of beer a day x 35 yrs  . Drug Use: No  . Sexually Active: None   Other Topics Concern  . None   Social History Narrative  . None     REVIEW OF SYSTEMS - PERTINENT POSITIVES ONLY: 12 point review of systems negative other than HPI and PMH.  EXAM: Filed Vitals:   08/25/11 1132  BP: 108/76  Pulse: 80  Temp: 97.1 F (36.2 C)    Gen:  No acute distress.  Well nourished and well groomed.   Neurological: Alert and oriented to person, place, and time. Coordination normal.  Head: Normocephalic and atraumatic.  Eyes: Conjunctivae are normal. Pupils are equal, round, and reactive to light. No scleral icterus.  Neck: Normal range of motion. Neck supple. No tracheal deviation or thyromegaly present.  Cardiovascular: Normal rate, regular rhythm, normal heart sounds and intact distal pulses.  Exam reveals no gallop and no friction rub.  No murmur heard. Respiratory: Effort normal.  No respiratory distress. No chest wall tenderness. Breath sounds normal.  No wheezes, rales or rhonchi.  GI: Soft. Bowel sounds are normal. The abdomen is soft and nontender.  There is no rebound and no guarding. There is hepatomegaly and splenomegaly.  No varices are seen on the abd wall.  No fluid wave.  Pt is very thin. Musculoskeletal: Normal range of motion. Extremities are nontender.  Lymphadenopathy: No cervical, preauricular, postauricular or axillary adenopathy is present Skin: Skin is warm and dry. No rash noted. No diaphoresis. No erythema. No pallor. No clubbing, cyanosis, or edema.   Psychiatric: Normal mood and affect. Behavior is normal. Judgment and thought content normal.    LABORATORY RESULTS: Available labs are reviewed, AST slightly elevated.  Bilirubin 0.6.  Plts 46k.     RADIOLOGY RESULTS: See E-Chart or I-Site for most recent results.  MRI and CT Images and reports are reviewed by myself and with the patient.  MRI impression.  1. 2 right liver lobe masses which are consistent  with  hepatocellular carcinomas.  2. Smaller foci of arterial hyperenhancement which could  represents satellite nodules or areas of altered perfusion.  3. Left liver lobe area of T1 hyperintensity and delayed  hypoenhancement. Favored to represent a regenerative or early  dysplastic nodule. No T2 hyperintensity or arterial phase  hyperenhancement to strongly suggest hepatocellular carcinoma.  Presuming the patient does not undergo liver transplant, recommend  follow-up with pre and post contrast MRI at 3 - 6 months to  reevaluate this area.  4. Nonspecific splenic lesions. 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  Recommend attention on follow-up.   ASSESSMENT AND PLAN Hepatocellular carcinoma Plan laparoscopic assisted microwave/RF ablation.   Will need to coordinate with Interventional radiology. Will need platelets post op. Discussed with patient risks of bleeding, infection, damage to adjacent structures, liver failure, pain, and fatigue.  Pt understands and wishes to proceed.     Maudry Diego MD Surgical Oncology, General and Endocrine Surgery [facility]   Visit Diagnoses: 1. Hepatocellular carcinoma     Primary Care Physician: Kirk Ruths, MD

## 2011-09-13 ENCOUNTER — Other Ambulatory Visit (INDEPENDENT_AMBULATORY_CARE_PROVIDER_SITE_OTHER): Payer: Self-pay | Admitting: General Surgery

## 2011-09-13 DIAGNOSIS — K769 Liver disease, unspecified: Secondary | ICD-10-CM

## 2011-09-13 DIAGNOSIS — C220 Liver cell carcinoma: Secondary | ICD-10-CM

## 2011-09-14 ENCOUNTER — Ambulatory Visit
Admission: RE | Admit: 2011-09-14 | Discharge: 2011-09-14 | Disposition: A | Discharge status: Home / Self Care | Payer: No Typology Code available for payment source | Source: Ambulatory Visit | Attending: General Surgery | Admitting: General Surgery

## 2011-09-14 ENCOUNTER — Encounter (INDEPENDENT_AMBULATORY_CARE_PROVIDER_SITE_OTHER): Payer: Self-pay | Admitting: General Surgery

## 2011-09-14 DIAGNOSIS — K769 Liver disease, unspecified: Secondary | ICD-10-CM

## 2011-09-14 DIAGNOSIS — C220 Liver cell carcinoma: Secondary | ICD-10-CM

## 2011-09-14 NOTE — Progress Notes (Signed)
Incidental finding of liver lesions during workup for back pain.  Liver biopsy of 07/04/2011 demonstrates hepatocellular carcinoma, well differentiated.  Pt denies abd discomfort, bloating, nausea etc.  Weight loss of approx 5 lbs.

## 2011-09-15 ENCOUNTER — Other Ambulatory Visit (HOSPITAL_COMMUNITY): Payer: Self-pay | Admitting: Interventional Radiology

## 2011-09-15 DIAGNOSIS — B1920 Unspecified viral hepatitis C without hepatic coma: Secondary | ICD-10-CM

## 2011-09-15 DIAGNOSIS — C220 Liver cell carcinoma: Secondary | ICD-10-CM

## 2011-09-16 ENCOUNTER — Telehealth (INDEPENDENT_AMBULATORY_CARE_PROVIDER_SITE_OTHER): Payer: Self-pay

## 2011-09-16 NOTE — Telephone Encounter (Signed)
Spoke with Andrew White.  Dr. Deanne Coffer has ordered additional imaging to be done at [facility].  Will know when to schedule surgery at that point.  Dr. Donell Beers is aware.

## 2011-10-04 ENCOUNTER — Other Ambulatory Visit: Payer: Self-pay | Admitting: Emergency Medicine

## 2011-10-04 DIAGNOSIS — I10 Essential (primary) hypertension: Secondary | ICD-10-CM

## 2011-10-04 DIAGNOSIS — R9089 Other abnormal findings on diagnostic imaging of central nervous system: Secondary | ICD-10-CM

## 2011-10-07 ENCOUNTER — Telehealth: Payer: Self-pay | Admitting: Emergency Medicine

## 2011-10-10 NOTE — Telephone Encounter (Signed)
ERROR

## 2011-10-11 ENCOUNTER — Other Ambulatory Visit (HOSPITAL_COMMUNITY): Payer: Self-pay | Admitting: Interventional Radiology

## 2011-10-11 ENCOUNTER — Ambulatory Visit (HOSPITAL_COMMUNITY)
Admission: RE | Admit: 2011-10-11 | Discharge: 2011-10-11 | Disposition: A | Discharge status: Home / Self Care | Payer: Medicaid Other | Source: Ambulatory Visit | Attending: Interventional Radiology | Admitting: Interventional Radiology

## 2011-10-11 DIAGNOSIS — B1920 Unspecified viral hepatitis C without hepatic coma: Secondary | ICD-10-CM

## 2011-10-11 DIAGNOSIS — C228 Malignant neoplasm of liver, primary, unspecified as to type: Secondary | ICD-10-CM | POA: Insufficient documentation

## 2011-10-11 DIAGNOSIS — C220 Liver cell carcinoma: Secondary | ICD-10-CM

## 2011-10-11 DIAGNOSIS — C229 Malignant neoplasm of liver, not specified as primary or secondary: Secondary | ICD-10-CM

## 2011-10-11 DIAGNOSIS — R161 Splenomegaly, not elsewhere classified: Secondary | ICD-10-CM | POA: Insufficient documentation

## 2011-10-11 MED ORDER — GADOBENATE DIMEGLUMINE 529 MG/ML IV SOLN
15.0000 mL | Freq: Once | INTRAVENOUS | Status: AC | PRN
  Administered 2011-10-11: 13 mL via INTRAVENOUS

## 2011-10-12 ENCOUNTER — Telehealth: Payer: Self-pay | Admitting: Emergency Medicine

## 2011-10-12 NOTE — Telephone Encounter (Signed)
DR Grace Isaac REVIEWED PATIENT'S MRI AND HE WILL CONTACT PT WITH RESULTS AND THEN CONTACT TINA AT [facility] TO CONFIRM TREATMENT DATES AND LABS.

## 2011-10-26 ENCOUNTER — Encounter (HOSPITAL_COMMUNITY): Payer: Self-pay

## 2011-10-26 ENCOUNTER — Encounter (HOSPITAL_COMMUNITY): Payer: Medicaid Other

## 2011-10-26 ENCOUNTER — Other Ambulatory Visit (HOSPITAL_COMMUNITY): Payer: Self-pay | Admitting: Interventional Radiology

## 2011-10-26 ENCOUNTER — Ambulatory Visit (HOSPITAL_COMMUNITY)
Admission: RE | Admit: 2011-10-26 | Discharge: 2011-10-26 | Disposition: A | Discharge status: Home / Self Care | Payer: Medicaid Other | Source: Ambulatory Visit | Attending: Interventional Radiology | Admitting: Interventional Radiology

## 2011-10-26 DIAGNOSIS — Z01810 Encounter for preprocedural cardiovascular examination: Secondary | ICD-10-CM | POA: Insufficient documentation

## 2011-10-26 DIAGNOSIS — Z01812 Encounter for preprocedural laboratory examination: Secondary | ICD-10-CM | POA: Insufficient documentation

## 2011-10-26 DIAGNOSIS — Z01818 Encounter for other preprocedural examination: Secondary | ICD-10-CM | POA: Insufficient documentation

## 2011-10-26 DIAGNOSIS — Z01811 Encounter for preprocedural respiratory examination: Secondary | ICD-10-CM

## 2011-10-26 DIAGNOSIS — C228 Malignant neoplasm of liver, primary, unspecified as to type: Secondary | ICD-10-CM | POA: Insufficient documentation

## 2011-10-26 DIAGNOSIS — M47814 Spondylosis without myelopathy or radiculopathy, thoracic region: Secondary | ICD-10-CM | POA: Insufficient documentation

## 2011-10-26 DIAGNOSIS — I252 Old myocardial infarction: Secondary | ICD-10-CM | POA: Insufficient documentation

## 2011-10-26 LAB — COMPREHENSIVE METABOLIC PANEL
AST: 52 U/L — ABNORMAL HIGH (ref 0–37)
Albumin: 4 g/dL (ref 3.5–5.2)
Alkaline Phosphatase: 83 U/L (ref 39–117)
CO2: 29 mEq/L (ref 19–32)
Chloride: 106 mEq/L (ref 96–112)
Creatinine, Ser: 0.69 mg/dL (ref 0.50–1.35)
GFR calc non Af Amer: >90 mL/min (ref >90)
Potassium: 4.7 mEq/L (ref 3.5–5.1)
Total Bilirubin: 1 mg/dL (ref 0.3–1.2)

## 2011-10-26 LAB — CBC
Hemoglobin: 15.9 g/dL (ref 13.0–17.0)
MCH: 33.8 pg (ref 26.0–34.0)
MCV: 92.4 fL (ref 78.0–100.0)
RBC: 4.71 MIL/uL (ref 4.22–5.81)
WBC: 2.9 10*3/uL — ABNORMAL LOW (ref 4.0–10.5)

## 2011-10-26 LAB — APTT: aPTT: 38 seconds — ABNORMAL HIGH (ref 24–37)

## 2011-10-26 NOTE — Patient Instructions (Signed)
20 Andrew White  10/26/2011   Your procedure is scheduled on:   11/04/11    Procedure  1130-1430   RADIOLOGY  Report to [facility] at  REPORT TO RADIOLOGY AT [facility] AT 800AM  Call this number if you have problems the morning of surgery: [phone]              Hibiclins shower Thurs snight and Friday Am- regular soap face and privates                 MAY TAKE aprazolam and oxycodone with sip of water if needed           Remember:   Do not eat food:After Midnight.  Thurs night  Do not drink clear liquids: After Midnight. Thurs night  Take these medicines the morning of surgery with A SIP OF WATER:  See above   Do not wear jewelry, make-up or nail polish.  Do not wear lotions, powders, or perfumes. You may wear deodorant.  Do not shave 48 hours prior to surgery.  Do not bring valuables to the hospital.  Contacts, dentures or bridgework may not be worn into surgery.  Leave suitcase in the car. After surgery it may be brought to your room.  For patients admitted to the hospital, checkout time is 11:00 AM the day of discharge.   Patients discharged the day of surgery will not be allowed to drive home.  Name and phone number of your driver:Lisa wife  Special Instructions: CHG Shower Use Special Wash: 1/2 bottle night before surgery and 1/2 bottle morning of surgery.   Please read over the following fact sheets that you were given: Blood Transfusion Information

## 2011-10-26 NOTE — Pre-Procedure Instructions (Signed)
Labs-    Results given to Inetta Fermo in IR at 1620 with request to show MD- PTT,PT,INR,CBC 10/26/11  L Mazey Mantell RN

## 2011-10-27 LAB — AFP TUMOR MARKER: AFP-Tumor Marker: 2.6 ng/mL (ref 0.0–8.0)

## 2011-10-31 ENCOUNTER — Encounter (HOSPITAL_COMMUNITY): Payer: Self-pay | Admitting: *Deleted

## 2011-11-01 NOTE — Pre-Procedure Instructions (Signed)
10/31/11 faxed EKG and old EKG from 2008 to Dr Darcus Pester with confirmation

## 2011-11-04 ENCOUNTER — Ambulatory Visit (HOSPITAL_COMMUNITY): Payer: Medicaid Other | Admitting: Anesthesiology

## 2011-11-04 ENCOUNTER — Encounter (HOSPITAL_COMMUNITY)
Admission: RE | Disposition: A | Discharge status: Home / Self Care | Payer: Self-pay | Source: Ambulatory Visit | Attending: Interventional Radiology

## 2011-11-04 ENCOUNTER — Encounter (HOSPITAL_COMMUNITY): Payer: Self-pay | Admitting: Anesthesiology

## 2011-11-04 ENCOUNTER — Observation Stay (HOSPITAL_COMMUNITY)
Admission: RE | Admit: 2011-11-04 | Discharge: 2011-11-05 | Disposition: A | Discharge status: Home / Self Care | Payer: Medicaid Other | Source: Ambulatory Visit | Attending: Interventional Radiology | Admitting: Interventional Radiology

## 2011-11-04 ENCOUNTER — Ambulatory Visit (HOSPITAL_COMMUNITY)
Admission: RE | Admit: 2011-11-04 | Discharge: 2011-11-04 | Disposition: A | Discharge status: Home / Self Care | Payer: Medicaid Other | Source: Ambulatory Visit | Attending: Interventional Radiology | Admitting: Interventional Radiology

## 2011-11-04 ENCOUNTER — Encounter (HOSPITAL_COMMUNITY): Payer: Self-pay | Admitting: *Deleted

## 2011-11-04 VITALS — BP 112/76 | HR 80 | Temp 98.3°F | Resp 14 | Ht 71.0 in | Wt 133.0 lb

## 2011-11-04 DIAGNOSIS — C228 Malignant neoplasm of liver, primary, unspecified as to type: Principal | ICD-10-CM | POA: Insufficient documentation

## 2011-11-04 DIAGNOSIS — D696 Thrombocytopenia, unspecified: Secondary | ICD-10-CM | POA: Insufficient documentation

## 2011-11-04 DIAGNOSIS — K7460 Unspecified cirrhosis of liver: Secondary | ICD-10-CM

## 2011-11-04 DIAGNOSIS — C229 Malignant neoplasm of liver, not specified as primary or secondary: Secondary | ICD-10-CM

## 2011-11-04 DIAGNOSIS — C220 Liver cell carcinoma: Secondary | ICD-10-CM

## 2011-11-04 DIAGNOSIS — Z79899 Other long term (current) drug therapy: Secondary | ICD-10-CM | POA: Insufficient documentation

## 2011-11-04 DIAGNOSIS — B1920 Unspecified viral hepatitis C without hepatic coma: Secondary | ICD-10-CM | POA: Insufficient documentation

## 2011-11-04 DIAGNOSIS — K769 Liver disease, unspecified: Secondary | ICD-10-CM

## 2011-11-04 DIAGNOSIS — R5381 Other malaise: Secondary | ICD-10-CM | POA: Insufficient documentation

## 2011-11-04 HISTORY — PX: RADIOFREQUENCY ABLATION LIVER TUMOR: SHX2293

## 2011-11-04 SURGERY — RADIO FREQUENCY ABLATION
Anesthesia: General | Wound class: Clean

## 2011-11-04 MED ORDER — SODIUM CHLORIDE 0.9 % IV SOLN: INTRAVENOUS | Status: DC

## 2011-11-04 MED ORDER — SENNOSIDES-DOCUSATE SODIUM 8.6-50 MG PO TABS
1.0000 | ORAL_TABLET | Freq: Every day | ORAL | Status: DC | PRN
  Filled 2011-11-04: qty 1

## 2011-11-04 MED ORDER — PROMETHAZINE HCL 25 MG/ML IJ SOLN: 6.2500 mg | INTRAMUSCULAR | Status: DC | PRN

## 2011-11-04 MED ORDER — PROPOFOL 10 MG/ML IV EMUL
INTRAVENOUS | Status: DC | PRN
  Administered 2011-11-04: 200 mg via INTRAVENOUS

## 2011-11-04 MED ORDER — DOCUSATE SODIUM 100 MG PO CAPS
100.0000 mg | ORAL_CAPSULE | Freq: Two times a day (BID) | ORAL | Status: DC
  Administered 2011-11-04 – 2011-11-05 (×2): 100 mg via ORAL
  Filled 2011-11-04 (×3): qty 1

## 2011-11-04 MED ORDER — GLYCOPYRROLATE 0.2 MG/ML IJ SOLN
INTRAMUSCULAR | Status: DC | PRN
  Administered 2011-11-04: .6 mg via INTRAVENOUS

## 2011-11-04 MED ORDER — SUCCINYLCHOLINE CHLORIDE 20 MG/ML IJ SOLN
INTRAMUSCULAR | Status: DC | PRN
  Administered 2011-11-04: 100 mg via INTRAVENOUS

## 2011-11-04 MED ORDER — ONDANSETRON HCL 4 MG/2ML IJ SOLN
INTRAMUSCULAR | Status: DC | PRN
  Administered 2011-11-04: 4 mg via INTRAVENOUS

## 2011-11-04 MED ORDER — SODIUM CHLORIDE 0.9 % IV SOLN
INTRAVENOUS | Status: DC
  Administered 2011-11-04 – 2011-11-05 (×2): via INTRAVENOUS

## 2011-11-04 MED ORDER — HYDROMORPHONE HCL PF 1 MG/ML IJ SOLN: 0.2500 mg | INTRAMUSCULAR | Status: DC | PRN

## 2011-11-04 MED ORDER — ROCURONIUM BROMIDE 100 MG/10ML IV SOLN
INTRAVENOUS | Status: DC | PRN
  Administered 2011-11-04: 10 mg via INTRAVENOUS
  Administered 2011-11-04: 5 mg via INTRAVENOUS
  Administered 2011-11-04: 10 mg via INTRAVENOUS
  Administered 2011-11-04: 15 mg via INTRAVENOUS
  Administered 2011-11-04: 30 mg via INTRAVENOUS
  Administered 2011-11-04 (×2): 20 mg via INTRAVENOUS

## 2011-11-04 MED ORDER — PROMETHAZINE HCL 25 MG/ML IJ SOLN
12.5000 mg | INTRAMUSCULAR | Status: DC | PRN
  Filled 2011-11-04: qty 1

## 2011-11-04 MED ORDER — HYDROCODONE-ACETAMINOPHEN 5-325 MG PO TABS: 1.0000 | ORAL_TABLET | ORAL | Status: DC | PRN

## 2011-11-04 MED ORDER — FENTANYL CITRATE 0.05 MG/ML IJ SOLN
INTRAMUSCULAR | Status: DC | PRN
  Administered 2011-11-04: 50 ug via INTRAVENOUS
  Administered 2011-11-04: 100 ug via INTRAVENOUS
  Administered 2011-11-04 (×2): 50 ug via INTRAVENOUS

## 2011-11-04 MED ORDER — SODIUM CHLORIDE 0.9 % IJ SOLN: 9.0000 mL | INTRAMUSCULAR | Status: DC | PRN

## 2011-11-04 MED ORDER — EPHEDRINE SULFATE 50 MG/ML IJ SOLN
INTRAMUSCULAR | Status: DC | PRN
  Administered 2011-11-04 (×3): 5 mg via INTRAVENOUS

## 2011-11-04 MED ORDER — LACTATED RINGERS IV SOLN
INTRAVENOUS | Status: DC
  Administered 2011-11-04: 14:00:00 via INTRAVENOUS
  Administered 2011-11-04: 1000 mL via INTRAVENOUS

## 2011-11-04 MED ORDER — CEFAZOLIN SODIUM 1-5 GM-% IV SOLN
1.0000 g | INTRAVENOUS | Status: AC
  Administered 2011-11-04: 1 g via INTRAVENOUS
  Filled 2011-11-04: qty 50

## 2011-11-04 MED ORDER — PROMETHAZINE HCL 25 MG/ML IJ SOLN: 12.5000 mg | INTRAMUSCULAR | Status: DC | PRN

## 2011-11-04 MED ORDER — ONDANSETRON HCL 4 MG/2ML IJ SOLN: 4.0000 mg | Freq: Four times a day (QID) | INTRAMUSCULAR | Status: DC

## 2011-11-04 MED ORDER — HYDROMORPHONE 0.3 MG/ML IV SOLN
INTRAVENOUS | Status: DC
  Administered 2011-11-04: 1.2 mL via INTRAVENOUS
  Administered 2011-11-04: 7.5 mg via INTRAVENOUS
  Administered 2011-11-05 (×2): 1.5 mg via INTRAVENOUS
  Administered 2011-11-05: 1.8 mg via INTRAVENOUS
  Filled 2011-11-04 (×2): qty 25

## 2011-11-04 MED ORDER — LIDOCAINE HCL (CARDIAC) 20 MG/ML IV SOLN
INTRAVENOUS | Status: DC | PRN
  Administered 2011-11-04: 50 mg via INTRAVENOUS

## 2011-11-04 MED ORDER — NALOXONE HCL 0.4 MG/ML IJ SOLN: 0.4000 mg | INTRAMUSCULAR | Status: DC | PRN

## 2011-11-04 MED ORDER — DIPHENHYDRAMINE HCL 50 MG/ML IJ SOLN: 12.5000 mg | Freq: Four times a day (QID) | INTRAMUSCULAR | Status: DC | PRN

## 2011-11-04 MED ORDER — DOCUSATE SODIUM 100 MG PO CAPS: 100.0000 mg | ORAL_CAPSULE | Freq: Two times a day (BID) | ORAL | Status: DC

## 2011-11-04 MED ORDER — SENNOSIDES-DOCUSATE SODIUM 8.6-50 MG PO TABS: 1.0000 | ORAL_TABLET | Freq: Every day | ORAL | Status: DC | PRN

## 2011-11-04 MED ORDER — NEOSTIGMINE METHYLSULFATE 1 MG/ML IJ SOLN
INTRAMUSCULAR | Status: DC | PRN
  Administered 2011-11-04: 4 mg via INTRAVENOUS

## 2011-11-04 MED ORDER — DIPHENHYDRAMINE HCL 12.5 MG/5ML PO ELIX: 12.5000 mg | ORAL_SOLUTION | Freq: Four times a day (QID) | ORAL | Status: DC | PRN

## 2011-11-04 MED ORDER — ONDANSETRON HCL 4 MG/2ML IJ SOLN: 4.0000 mg | Freq: Four times a day (QID) | INTRAMUSCULAR | Status: DC | PRN

## 2011-11-04 MED ORDER — ONDANSETRON HCL 4 MG/2ML IJ SOLN
4.0000 mg | Freq: Four times a day (QID) | INTRAMUSCULAR | Status: DC
  Administered 2011-11-04 – 2011-11-05 (×3): 4 mg via INTRAVENOUS
  Filled 2011-11-04 (×3): qty 2

## 2011-11-04 MED ORDER — SODIUM CHLORIDE 0.9 % IV SOLN
INTRAVENOUS | Status: DC | PRN
  Administered 2011-11-04: 12:00:00 via INTRAVENOUS

## 2011-11-04 NOTE — Anesthesia Postprocedure Evaluation (Signed)
  Anesthesia Post-op Note  Patient: Andrew White  Procedure(s) Performed:  RADIO FREQUENCY ABLATION - Liver Ablation   Patient Location: PACU  Anesthesia Type: General  Level of Consciousness: oriented and sedated  Airway and Oxygen Therapy: Patient Spontanous Breathing and Patient connected to nasal cannula oxygen  Post-op Pain: mild  Post-op Assessment: Post-op Vital signs reviewed, Patient's Cardiovascular Status Stable, Respiratory Function Stable and Patent Airway  Post-op Vital Signs: stable  Complications: No apparent anesthesia complications

## 2011-11-04 NOTE — Procedures (Signed)
Successful CT guided ablation of two areas of HCC, both within the right lobe of the liver.  Hydrodissection was utilized to displace bowel from ablation zone of inferior lesion.  Patient tolerated procedure well but there was a small skin burn at second ablation site.  Patient to be observed overnight and if doing well will be d/c'd home.  Patient will follow-up with me in 1 month at [facility] clinic, at which time we will obtain an abdominal CT to evaluate the ablation zones.

## 2011-11-04 NOTE — Anesthesia Preprocedure Evaluation (Addendum)
Anesthesia Evaluation  Patient identified by MRN, date of birth, ID band Patient awake  General Assessment Comment:Infection precautions  Reviewed: Allergy & Precautions, H&P , NPO status , Patient's Chart, lab work & pertinent test results, reviewed documented beta blocker date and time   Airway Mallampati: II TM Distance: >3 FB Neck ROM: Full    Dental  (+) Partial Upper   Pulmonary Current Smoker,  clear to auscultation        Cardiovascular hypertension, Pt. 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

## 2011-11-04 NOTE — H&P (Signed)
Andrew White is an 55 y.o. male.   Chief Complaint: Liver lesions HPI: 55 yo male referred to Dr. Grace Isaac for evaluation of liver lesions. Please refer to his consult note dated 09/13/11.   IR Radiologist Eval & Mgmt Status:  Final result                     Study Result     *RADIOLOGY REPORT*  NEW PATIENT OFFICE VISIT - LEVEL IV ([id number])  Chief Complaint: Hepatitis C, Multifocal hepatic cellular carcinoma  History of Present illness: This is a pleasant 55 year old male  with history of hepatitis C and thrombocytopenia who during the  evaluation/workup of back pain was incidentally discovered to have  several liver lesions. Ultrasound-guided liver biopsy performed  07/04/2011 confirmed the diagnosis of well differentiated hepatic  cellular carcinoma. Abdominal MRI performed 07/31 demonstrates two  discrete lesions within the right lobe of the liver. The patient  presents to interventional radiology clinic with his wife at the  referral of Dr. Donell Beers (general surgery) for consideration of  candidacy of percutaneous thermal ablation of multifocal HCC.  The patient is currently without symptomatic. He admits to  approximately 5 pounds of weight loss during the recent months  which he attributes to increased activity over the summer. Denies  change in appetite or energy level. The patient denies abdominal  pain, increasing abominal girth or the development of ascites.  Denies lower extremity swelling. Denies change in bowel or bladder  function. Denies yellowing of the skin or eyes. Denies confusion.  Past Medical History:  1. Hepatitis C  2. Thrombocytopenia  3. Hypertension  Medications:  1. Lisinopril 10 mg daily  2. Alprazolam 1 mg TID  3. Oxycodene 5 mg prn pain  Allergies: None, denies contrast or latex allergy  Past Surgical History: None  Social History: The patient is married with 3 children (two  biological, one-step daughter); lives in [place], [place],  currently  un-employed. Denies alcohol or illicit drug use. Admits to  smoking 1/2 packet cigarettes per day.  Family History: Family history of lung cancer, diabetes,  hypertension, stroke  REVIEW OF SYSTEMS:  General: Reports approximately 5 pounds weight loss during the  summer which he attributes to increased activity over the summer.  Denies change in appetite or energy level.  Skin: Denies yellowing of the skin, itch or rash.  HEENT: Denies yellowing of the eyes; Denies change in vision or  hearing.  Neurologic: Denies focal weakness, headache, memory loss or  confusion.  Heart: Denies chest pain or palpitations  Lungs: Denies shortness of breath, cough or hemoptysis  GI: Denies nausea or vomiting, increasing abominal girth, diarrhea,  bloody or tarry stools.  GU: No hematuria, urinary retention or incontinence.  Musculoskeletal: Denies joint pain.  PHYSICAL EXAMINATION:  Vital signs: Height - 5 feet 10 inches; Weight - 130 lbs; BP -  112/72; HR - 90; RR - 14; 100% on RA; Temp - 98.1  General examination: Thin, Caucasian male in no acute distress,  alert, oriented and appropriate.  Skin: No active lesions. Tattoos on bilateral upper extremities.  HEENT: PERRLA, no yellowing of the skin or eyes.  Lungs: Rales and wheezing within the bilateral lung bases.  Heart: Regular rhythm and rate.  Abdomen: Soft, no masses; No hepatomegaly; No guarding or rebound;  No ascites; Normal bowel sounds.  GU/Rectal: Deferred  Extremities: No lower extremity edema.  ECOG level: 0  Imaging: Abdominal MRI - 07/26/2011 - the dominant lesion  within  the inferior aspect of segment 5 of the right lobe of the liver  measures approximately 3.0 x 2.9 cm. This lesion underwent  ultrasound guided biopsy (performed 07/04/2011) with pathology  revealing well differentiated hepatic cellular carcinoma. There is  an additional peripheral lesion within segment 6 of the right lobe  of the liver measures  approximate 2.5 x 2.6 cm. There are several  adjacent areas of hyperenhancement which are indeterminate. There  is a possible regenerating nodule within the left lobe of the  liver.  Assessment and Plan:  This is a very pleasant 55 year old male with hepatitis C and  thrombocytopenia with biopsy-proven hepatocellular carcinoma.  Surgical treatment options, including transplantation, have been  discussed by the referring physician, Dr. Rowan Blase (general  surgery), who has referred to the patient for evaluation of  potential candidacy for thermal ablation. Given the size (<3 cm)  and number (2 definitive lesions), the patient is a candidate for  thermal ablation. The location of the lesions is somewhat  problematic with both lesions located about the periphery of the  liver, however with ultrasound and CT guidance and the possibility  of hydrodissection, I believe it is worth attempting this liver  sparing treatment. Potential complications including but not  limite to: pneumothorax, pleural effusion, diaphragmatic injury,  hemorrhage, hepatic abscess, liver failure and pain were discussed  at length with the patient and the patient's wife. At this point,  the patient wishes to pursue this intervention.  Pending insurance approval and anesthesia clearance, the ablation  will be scheduled at the next available general anesthesia slot at  [facility], likely not until mid/late November (due to  the lack of anesthesia slots during the transition to the [facility]  computer system). Given the likely delay in scheduling the  procedure, I will order a pre-procedural/surveillance abdominal MRI  to be performed in early to mid October. The ablation will require  overnight admission to the hospital for monitoring and PCA usage.  Preprocedural labs will be obtained (PT/INR, PTT, CBC, CMP, AFP).  The procedure will likely require the transfusion of platelets (as  was done for the US guided  biopsy).  Original Report Authenticated By: Waynard Reeds, M.D.     Past Medical History  Diagnosis Date  . Liver lesion, right lobe 07/02/2011  . Cirrhosis of liver 07/02/2011  . Low back pain     radiates to right side  . Thrombocytopenia   . Hepatitis C 07/02/2011  . Hepatocellular carcinoma 07/08/2011  . Anxiety   . Hypertension     per past medical records/faxed EKG to [id number] 10/31/11 with confirmation- old EKG from 2008 on chart    Past Surgical History  Procedure Date  . Liver biopsy 07/04/11    Done at [facility]  . Marijuana years ago, no alcohol 4 years     Family History  Problem Relation Age of Onset  . Cancer Father     lung   Social History:  reports that he has been smoking Cigarettes.  He has been smoking about .5 packs per day. He has never used smokeless tobacco. He reports that he does not drink alcohol or use illicit drugs.  Allergies:  Allergies  Allergen Reactions  . No Known Drug Allergy     Medications Prior to Admission  Medication Dose Route Frequency Provider Last Rate Last Dose  . ceFAZolin (ANCEF) IVPB 1 g/50 mL premix  1 g Intravenous On Call D Caryn Bee  Allred, PA      . lactated ringers infusion   Intravenous Continuous D Jeananne Rama, PA       Medications Prior to Admission  Medication Sig Dispense Refill  . ALPRAZolam (XANAX) 0.5 MG tablet Take 1 mg by mouth 3 (three) times daily as needed. For anxiety/sleep       . aspirin 81 MG tablet Take 81 mg by mouth daily.       Marland Kitchen lisinopril (PRINIVIL,ZESTRIL) 10 MG tablet Take 10 mg by mouth daily.       Marland Kitchen oxycodone (OXY-IR) 5 MG capsule Take 5 mg by mouth every 6 (six) hours as needed. For pain      . TraMADol HCl (ULTRAM PO) Take by mouth. Confirm dosage and frequency with patient.        No results found for this or any previous visit (from the past 48 hour(s)). No results found.  Review of Systems  Constitutional: Negative for fever and chills.  Respiratory: Negative for shortness of  breath.   Cardiovascular: Negative for chest pain.  Gastrointestinal: Negative.   Genitourinary: Negative.   Endo/Heme/Allergies: Negative.     There were no vitals taken for this visit.Physical Exam  BP- 118/79   R - 18  P - 85   T - 98.5   Sat 98% RA Heent - unremarkable Heart - RRR Lungs - Clear Abd - NT Exts =- Warm - Pulses weak to absent - No edema  Assessment/Plan The patient presents today for thermal ablation of liver lesion.  Andrew White 11/04/2011, 9:53 AM

## 2011-11-04 NOTE — Progress Notes (Signed)
  Patient resting in bed.  Wife at bedside.  States minimal/expected right upper abdominal pain.  Admits to headache but no nausea or vomiting.  Tolerating PO.  PCA being assembled by nursing.  VSS; SCDs in place. Dressing is C/D/I. Abdomen is soft.  Will be observed overnight.  If remains stable, tolerating PO and pain controlled with PO meds will likely d/c in AM.  F/U in VIR clinic in 1 month with cirrhosis protocol abdominal CT at that time.

## 2011-11-04 NOTE — Transfer of Care (Signed)
Immediate Anesthesia Transfer of Care Note  Patient: Andrew White  Procedure(s) Performed:  RADIO FREQUENCY ABLATION - Liver Ablation   Patient Location: PACU  Anesthesia Type: General  Level of Consciousness: awake, alert  and oriented  Airway & Oxygen Therapy: Patient Spontanous Breathing and Patient connected to face mask oxygen  Post-op Assessment: Report given to PACU RN, Post -op Vital signs reviewed and stable and Patient moving all extremities X 4  Post vital signs: Reviewed and stable  Complications: No apparent anesthesia complications

## 2011-11-04 NOTE — Progress Notes (Signed)
Report given to Daine Gip, CRNA.  Pt transported to CT for procedure via stretcher.

## 2011-11-05 LAB — PREPARE PLATELET PHERESIS
Unit division: 0
Unit division: 0

## 2011-11-05 MED ORDER — BACITRACIN-NEOMYCIN-POLYMYXIN 400-5-5000 EX OINT
TOPICAL_OINTMENT | CUTANEOUS | Status: AC
  Filled 2011-11-05: qty 2
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
## 2011-11-05 NOTE — Progress Notes (Signed)
ABD dressing changed prior to discharge. Discharge teaching completed. Prescription given by MD. For Vicodin 1 tab q 4-6 hours as needed for pain. All discharge instructions reviewed. Handout given from [facility] on Wound Infection. Pt. Left with spouse via wheelchair. No resp. Distress noted.

## 2011-11-07 ENCOUNTER — Other Ambulatory Visit (HOSPITAL_COMMUNITY): Payer: Self-pay | Admitting: Interventional Radiology

## 2011-11-07 ENCOUNTER — Other Ambulatory Visit: Payer: Self-pay | Admitting: Interventional Radiology

## 2011-11-07 DIAGNOSIS — C220 Liver cell carcinoma: Secondary | ICD-10-CM

## 2011-11-07 DIAGNOSIS — B1920 Unspecified viral hepatitis C without hepatic coma: Secondary | ICD-10-CM

## 2011-11-08 ENCOUNTER — Other Ambulatory Visit: Payer: Self-pay | Admitting: Interventional Radiology

## 2011-11-08 ENCOUNTER — Ambulatory Visit (HOSPITAL_COMMUNITY)
Admission: RE | Admit: 2011-11-08 | Discharge: 2011-11-08 | Disposition: A | Discharge status: Home / Self Care | Payer: Medicaid Other | Source: Ambulatory Visit | Attending: Interventional Radiology | Admitting: Interventional Radiology

## 2011-11-08 ENCOUNTER — Other Ambulatory Visit (HOSPITAL_COMMUNITY): Payer: Self-pay | Admitting: Interventional Radiology

## 2011-11-08 ENCOUNTER — Ambulatory Visit
Admission: RE | Admit: 2011-11-08 | Discharge: 2011-11-08 | Disposition: A | Discharge status: Home / Self Care | Payer: Self-pay | Source: Ambulatory Visit | Attending: Interventional Radiology | Admitting: Interventional Radiology

## 2011-11-08 ENCOUNTER — Telehealth: Payer: Self-pay | Admitting: Emergency Medicine

## 2011-11-08 DIAGNOSIS — D739 Disease of spleen, unspecified: Secondary | ICD-10-CM | POA: Insufficient documentation

## 2011-11-08 DIAGNOSIS — C787 Secondary malignant neoplasm of liver and intrahepatic bile duct: Secondary | ICD-10-CM

## 2011-11-08 DIAGNOSIS — R1011 Right upper quadrant pain: Secondary | ICD-10-CM | POA: Insufficient documentation

## 2011-11-08 DIAGNOSIS — J9819 Other pulmonary collapse: Secondary | ICD-10-CM | POA: Insufficient documentation

## 2011-11-08 DIAGNOSIS — R509 Fever, unspecified: Secondary | ICD-10-CM

## 2011-11-08 DIAGNOSIS — I700 Atherosclerosis of aorta: Secondary | ICD-10-CM | POA: Insufficient documentation

## 2011-11-08 DIAGNOSIS — R161 Splenomegaly, not elsewhere classified: Secondary | ICD-10-CM | POA: Insufficient documentation

## 2011-11-08 MED ORDER — IOHEXOL 300 MG/ML  SOLN
100.0000 mL | Freq: Once | INTRAMUSCULAR | Status: AC | PRN
  Administered 2011-11-08: 100 mL via INTRAVENOUS

## 2011-11-08 NOTE — Telephone Encounter (Signed)
PAGING DR WATTS TO SEE IF PT. NEEDS TO BE SEEN TODAY.- PER DR WATTS, PT TO HAVE CT ABD W/ IV AND ORAL AND TO BE SEEN IN OFFICE TODAY.    8:42AM- CALLED PT BACK TO TELL S/W WIFE AND TOLD HER TO HAVE PT AT [facility] RADIOLOGY AT 1230PM TO DRINK ORAL CONTRAST FOR AT 230PM SCAN AND COME STRAIGHT TO OUR OFFICE TO BE SEEN AFTER CT TODAY. WIFE ALSO STATED BEEN GIVING PT STOOL SOFTNER AND NO BM AS OF YET.

## 2011-11-08 NOTE — Progress Notes (Signed)
Patient is accompanied by his daughter.  They report fever of 102 yesterday evening.  Taking children's motrin for fever.  Also, states that he was experiencing dull, abd pain & cold sweats.  CT abd obtained @ [facility].    Appetite:  Poor-fair.  Pt denies nausea or vomiting.    Has been taking oxycodone for RLQ pain w/ minimal -moderate relief.  Also, reports that he has been taking oxycodone regularly for back pain for "quite some time".    States that he had a BM today.

## 2011-11-11 ENCOUNTER — Telehealth (INDEPENDENT_AMBULATORY_CARE_PROVIDER_SITE_OTHER): Payer: Self-pay

## 2011-11-11 NOTE — Telephone Encounter (Signed)
Called the pt's wife to f/u on his condition.  He saw Dr. Donell Beers 07/2011 to consult for lap assisted liver ablation surgery.  Mr. Isadore went to see Dr. Lawernce Pitts at [facility] who requested he have another scan. The pt was then seen by Dr. Elmon Kirschner and had the procedure performed.  He is following up with Dr. Mitzi Hansen at [facility] [place].  His wife states he is doing very well.

## 2011-11-22 ENCOUNTER — Telehealth: Payer: Self-pay | Admitting: Hematology and Oncology

## 2011-11-22 NOTE — Telephone Encounter (Signed)
gv pt appt for 12/7 @ 9:30 am w/LO

## 2011-12-02 ENCOUNTER — Telehealth: Payer: Self-pay | Admitting: Nutrition

## 2011-12-02 ENCOUNTER — Encounter: Payer: Self-pay | Admitting: Hematology and Oncology

## 2011-12-02 ENCOUNTER — Telehealth: Payer: Self-pay | Admitting: Hematology and Oncology

## 2011-12-02 ENCOUNTER — Ambulatory Visit: Payer: Self-pay

## 2011-12-02 ENCOUNTER — Other Ambulatory Visit: Payer: Self-pay | Admitting: Nurse Practitioner

## 2011-12-02 ENCOUNTER — Ambulatory Visit (HOSPITAL_BASED_OUTPATIENT_CLINIC_OR_DEPARTMENT_OTHER): Payer: Medicaid Other | Admitting: Hematology and Oncology

## 2011-12-02 VITALS — BP 120/79 | HR 93 | Temp 97.9°F | Ht 71.0 in | Wt 130.9 lb

## 2011-12-02 DIAGNOSIS — C220 Liver cell carcinoma: Secondary | ICD-10-CM

## 2011-12-02 DIAGNOSIS — B1920 Unspecified viral hepatitis C without hepatic coma: Secondary | ICD-10-CM

## 2011-12-02 DIAGNOSIS — C228 Malignant neoplasm of liver, primary, unspecified as to type: Secondary | ICD-10-CM

## 2011-12-02 DIAGNOSIS — R161 Splenomegaly, not elsewhere classified: Secondary | ICD-10-CM

## 2011-12-02 DIAGNOSIS — K7460 Unspecified cirrhosis of liver: Secondary | ICD-10-CM

## 2011-12-02 NOTE — Telephone Encounter (Signed)
Called pt, left message, appt for Nutritionist is 12/06/11

## 2011-12-02 NOTE — Progress Notes (Signed)
This office note has been dictated.

## 2011-12-02 NOTE — Progress Notes (Signed)
CC:   Radene Gunning, M.D., Ph.D. Kirk Ruths, M.D. Almond Lint, MD Jodi Marble. Fredia Sorrow, M.D. Simonne Come, MD  IDENTIFYING STATEMENT:  The patient is a 55 year old man seen at the request of Dr. Simonne Come with hepatocellular carcinoma.    HISTORY OF PRESENT ILLNESS:  The patient has a past medical history significant for hepatitis C diagnosed in 2007.  This is complicated with liver cirrhosis.  He was found to have progressive thrombocytopenia and referred to Hematology in [place] for further workup.  A CT scan of the abdomen and pelvis on 05/30/2011 had shown clear lung bases.  There were 2 hypervascular lesions in the right lobe of the liver; a 2.3 x 2.1 x 2.0 cm laterally in the inferior right lobe and a 2.7 x 2.4 x 2.5 cm medially in the inferior right lobe. The spleen was large measuring 13.4 x 8.7 x 19.0 cm.  There was no adenopathy.  There were no osseous findings.  On 07/04/2011 he received a core needle biopsy of 1 of liver lesions and surgical pathology was consistent with a well-differentiated hepatocellular carcinoma.  He also had additional abdominal radiographic studies and on 10/11/2011 an MRI of the abdomen with and without contrast confirmed the two right liver lobe hepatocellular carcinoma within a cirrhotic liver.  The spleen was enlarged.  There was no adenopathy. He was referred to Dr. Dorothy Puffer and Dr. Almond Lint. Dr. Mitzi Hansen felt the patient was not a candidate for radiation.  Dr. Donell Beers referred the patient for a microwave/RF ablation with interventional radiology.  On 11/04/2011 the patient underwent a CT-guided percutaneous microwave ablation with Dr. Simonne Come and Dr. Irish Lack.  He required platelets prior to the procedure.  The patient states he tolerated the therapy well.  He is asymptomatic.  He follows up with them next week. He presents with his wife for an oncology opinion.  PAST MEDICAL HISTORY: 1. Hepatitis C complicated by cirrhosis and  splenomegaly. 2. Hepatocellular carcinoma secondary to #1. 3. Thrombocytopenia secondary to #1. 4. Hypertension. 5. L4-L5 slipped disk.  MEDICATIONS:  Xanax 1 mg 3 times as needed, lisinopril 10 mg daily, multivitamins 1 tab daily, oxycodone 10 mg every 4 hours as needed.  SOCIAL HISTORY:  The patient is married with 3 children.  He lives in [place].  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  He is a former drinker and last drank 4 years ago, prior to right abstinence he drank 12-18 pack of beer a day.  He smokes half a pack a day and has done so for a number of years.  FAMILY HISTORY:  Patient's father had lung cancer.  Maternal grandfather colon cancer.  Maternal grandmother also had colon cancer.  HEALTH MAINTENANCE:  He has close follow up with Dr. Regino Schultze in [place].  He has never received a colonoscopy.  He recently completed a series of hep B  injections.  He is up to date with pneumonia and flu vaccine.  REVIEW OF SYSTEMS:  Constitutional:  Denies fever, chills, night sweats, anorexia.  Has chronic back pain.  Denies abdominal pain.  Has had no weight loss.  GI:  Denies nausea, vomiting, abdominal pain, diarrhea, melena, hematochezia.  Cardiovascular:  Denies chest pain, PND, orthopnea, ankle swelling.  Respirations:  Denies cough, hemoptysis, wheeze, shortness of breath.  Musculoskeletal:  Denies joint aches, muscle pains.  Skin:  Denies bruising or bleeding.  Neurologic:  Denies headaches, vision changes, extremity weakness.  Rest of review of systems  negative.  PHYSICAL EXAMINATION:  The patient is alert and oriented x3.  Vitals: Pulse 93, blood pressure 120/79, temperature 97.9, respirations 20, weight 130 pounds.  ECOG 1.  HEENT:  Head is atraumatic, normocephalic. Extraocular muscles intact.  Sclerae anicteric.  Mouth moist without ulcerations, thrush or lesions.  Neck:  Supple without adenopathy and trachea center.  Chest:  Good entry bilaterally.  Lungs  clear to both percussion and auscultation.  Cardiovascular:  1st and 2nd heart sounds present.  No added sounds or murmurs.  Abdomen:  Soft, nontender.  Tip of the spleen palpable.  No palpable other masses.  Bowel sounds present.  Extremities:  No edema.  Pulses present and symmetrical. Lymph nodes:  No palpable cervical, axillary or inguinal adenopathy. CNS:  Nonfocal.  IMPRESSION AND PLAN:  Mr. Albarran is a 55 year old gentleman with hepatocellular carcinoma complicated by cirrhosis with history of hepatitis C.  He also has splenomegaly with thrombocytopenia.  He presents for oncology opinion.  He is status post microwave ablation on 11/04/2011.  The patient was told that in general hepatocellular carcinoma is chemo resistant.  However, there is targeted treatments such as oral tyrosine kinase inhibitor such as Sorafenib has shown some efficacy when compared to best supportive care.  We discussed some significant adverse events that could occur such as hypertension, fatigue, dermatological reaction such as thrush, hand-foot syndrome, alopecia, pruritus, erythema, electrolyte disturbance, diarrhea, abdominal pain, myelosuppression and liver dysfunction.  The patient was given literature to review.  However, he has not had a referral as yet to a hepatology/transplant department at a tertiary center.  Will assist him in making a referral.  From all accounts he has never seen an ID doctor, so will assist him in seeing Dr. Daiva Eves for evaluation and treatment if needed of his hepatitis C.  He will also be referred to Dr. Jena Gauss in [place] for colonoscopy.  He sees interventional radiology in the upcoming weeks for followup CT scan.  He and his wife had a number of questions which were answered.  He follows up in 2 months' time or sooner.    ______________________________ Laurice Record, M.D. LIO/MEDQ  D:  12/02/2011  T:  12/02/2011  Job:  [id number]

## 2011-12-02 NOTE — Telephone Encounter (Signed)
Called Dr Benard Rink Apolonio Schneiders, pt will be seen on 12/11/ 1030 am, gave referral form to HIM. Called hepatitis clinic , they need to review chart first before appt. Filled out referral form to HIM.

## 2011-12-05 ENCOUNTER — Telehealth: Payer: Self-pay | Admitting: Internal Medicine

## 2011-12-05 NOTE — Telephone Encounter (Signed)
Pt's cancer doctor had called here Friday to set up OV and OV was made for 12/11 at 1030. Pt is NUR patient and I called pt and LMOM explaining that he should call NUR's office and get this OV set up there and to disregard OV for here tomorrow.

## 2011-12-05 NOTE — Telephone Encounter (Signed)
Pt's wife Misty Stanley) had called me back to explain that patient had never seen NUR and would prefer to see RMR since she was a RMR patient. I [facility] patient to come in next Monday to see AS. Patient needs a consult for a colonoscopy and has liver cancer. I told wife that it would help if they could bring any medical records that they could on Patient. They will be stopping by to sign a release of information form.

## 2011-12-05 NOTE — Telephone Encounter (Signed)
Noted  

## 2011-12-06 ENCOUNTER — Ambulatory Visit: Payer: Self-pay | Admitting: Nutrition

## 2011-12-06 ENCOUNTER — Encounter: Payer: Self-pay | Admitting: Nutrition

## 2011-12-06 ENCOUNTER — Telehealth: Payer: Self-pay | Admitting: Hematology and Oncology

## 2011-12-06 ENCOUNTER — Ambulatory Visit: Payer: Self-pay | Admitting: Gastroenterology

## 2011-12-06 NOTE — Telephone Encounter (Signed)
Faxed pt. Medical records to [facility]. Dr will review the records and his office will call pt with appt. Pt. Is aware.

## 2011-12-06 NOTE — Assessment & Plan Note (Signed)
REASON FOR ASSESSMENT:  This is a 55 year old male patient of Dr. Dalene Carrow diagnosed with hepatocellular cancer.  MEDICAL HISTORY INCLUDES: 1. Hepatitis C. 2. Liver cirrhosis. 3. Hypertension. 4. Alcohol and tobacco usage. 5. Status post ablation.  MEDICATIONS INCLUDE: 1. Xanax. 2. Multivitamin.  LABS:  He does not have any labs to review that are recent.  HEIGHT:  71 inches. WEIGHT:  130 pounds. USUAL BODY WEIGHT:  140 pounds. BMI:  18.2  PATIENT STATES:  The patient reports he is really not having any difficulty eating.  He does not typically eat midday.  He will eat a little something in the morning and at night time.  However, he is drinking 4-5 Ensure Plus daily.  He is interested in assistance with getting additional samples of Ensure and some coupons.  NUTRITION DIAGNOSIS:  Food and nutrition-related knowledge deficit related to new diagnosis of hepatocellular cancer as evidenced by no prior need for nutrition-related information.  INTERVENTION:  I have educated the patient and wife on the importance of small, frequent meals with high-calorie and high-protein foods throughout the day.  We have talked about a goal of weight maintenance as patient has already lost about 10 pounds (7%) from what is a usual body weight for him.  I have encouraged him to continue Ensure Plus at least q.i.d. but to also supplement with some high-calorie and high-protein snacks and meals.  I have provided him with some coupons and a complimentary case of Ensure Plus.  I have also given him fact sheets on ways to increase calories and protein.  The patient and wife are very appreciative of information and supplements.  MONITORING/EVALUATION (GOALS):  The patient will tolerate increased oral intake to minimize weight loss and to maintain lean body mass.  NEXT VISIT:  The patient will call me with questions or concerns or if he desires followup in the future.    ______________________________ Andrew White, RD, LDN Clinical Nutrition Specialist BN/MEDQ  D:  12/06/2011  T:  12/06/2011  Job:  [id number]

## 2011-12-08 ENCOUNTER — Ambulatory Visit (HOSPITAL_COMMUNITY)
Admission: RE | Admit: 2011-12-08 | Discharge: 2011-12-08 | Disposition: A | Discharge status: Home / Self Care | Payer: Medicaid Other | Source: Ambulatory Visit | Attending: Interventional Radiology | Admitting: Interventional Radiology

## 2011-12-08 ENCOUNTER — Ambulatory Visit
Admission: RE | Admit: 2011-12-08 | Discharge: 2011-12-08 | Disposition: A | Discharge status: Home / Self Care | Payer: Self-pay | Source: Ambulatory Visit | Attending: Interventional Radiology | Admitting: Interventional Radiology

## 2011-12-08 ENCOUNTER — Encounter (HOSPITAL_COMMUNITY): Payer: Self-pay

## 2011-12-08 VITALS — BP 132/77 | HR 86 | Temp 97.7°F | Resp 15 | Ht 71.0 in | Wt 130.5 lb

## 2011-12-08 DIAGNOSIS — R161 Splenomegaly, not elsewhere classified: Secondary | ICD-10-CM | POA: Insufficient documentation

## 2011-12-08 DIAGNOSIS — B1920 Unspecified viral hepatitis C without hepatic coma: Secondary | ICD-10-CM

## 2011-12-08 DIAGNOSIS — C220 Liver cell carcinoma: Secondary | ICD-10-CM

## 2011-12-08 DIAGNOSIS — C228 Malignant neoplasm of liver, primary, unspecified as to type: Secondary | ICD-10-CM | POA: Insufficient documentation

## 2011-12-08 MED ORDER — IOHEXOL 300 MG/ML  SOLN
100.0000 mL | Freq: Once | INTRAMUSCULAR | Status: AC | PRN
  Administered 2011-12-08: 100 mL via INTRAVENOUS

## 2011-12-08 NOTE — Progress Notes (Signed)
Pt states that he is doing well, "feeling good".  Alert & oriented.  Skin warm & dry.  Color good.  Appetite good.  Weight 130.5, ^ 4 lbs.  Denies nausea, vomiting or diarrhea.  Denies pain.  States that he has very minimal discomfort "over my liver, just enough to know that it's there".  Is not requiring pain meds for RFA.  Has been taking oxycodone for back discomfort x several months.    Able to complete ADL's.  Has an appointment on Tuesday, December 18th at [facility] in [place].  To be seen by Dr. Joetta Manners.  Possible for liver transplant.

## 2011-12-12 ENCOUNTER — Ambulatory Visit (INDEPENDENT_AMBULATORY_CARE_PROVIDER_SITE_OTHER): Payer: Self-pay | Admitting: Gastroenterology

## 2011-12-12 ENCOUNTER — Encounter: Payer: Self-pay | Admitting: Gastroenterology

## 2011-12-12 ENCOUNTER — Telehealth: Payer: Self-pay | Admitting: Hematology and Oncology

## 2011-12-12 DIAGNOSIS — C220 Liver cell carcinoma: Secondary | ICD-10-CM

## 2011-12-12 DIAGNOSIS — Z1211 Encounter for screening for malignant neoplasm of colon: Secondary | ICD-10-CM

## 2011-12-12 DIAGNOSIS — K7460 Unspecified cirrhosis of liver: Secondary | ICD-10-CM

## 2011-12-12 DIAGNOSIS — B1920 Unspecified viral hepatitis C without hepatic coma: Secondary | ICD-10-CM

## 2011-12-12 DIAGNOSIS — C228 Malignant neoplasm of liver, primary, unspecified as to type: Secondary | ICD-10-CM

## 2011-12-12 NOTE — Progress Notes (Signed)
Referring Provider: Dr. Dalene Carrow Primary Care Physician:  Kirk Ruths, MD Primary Gastroenterologist:  Dr. Jena Gauss   Chief Complaint  Patient presents with  . Colonoscopy    HPI:   Andrew White is a pleasant 55 year old male who presents at the request of Dr. Dalene Carrow for an initial screening colonoscopy. Unfortunately, he was diagnosed with [facility] in July 2012. He has undergone radiofrequency ablation. He is followed by Dr. Dalene Carrow of Hem/Onc and will be seeing [facility] transplant clinic on December 18. He also has a hx of HCV cirrhosis and has been referred to Dr. Algis Liming for possible treatment of Hep C. Overall, his labs are up-to-date as well as AFP. Last CT was in Nov 2012. These are outlined below. Denies any jaundice, pruritis.  He denies any abdominal pain. No changes in bowel habits. Denies any melena or hematochezia. He has gained 6 lbs since diagnosis of HCC. He reports a good appetite. BM daily to every other day. He has had no prior colonoscopy or EGD. He was actually seen by Dorene Ar in the summer; however, he would like to be followed by Dr. Jena Gauss.   CBC October 2012: no anemia, WBC 2.9, PLT 44. PT 15.6, INR 1.21, AST 52, ALT 59, AFP 2.6  Past Medical History  Diagnosis Date  . Liver lesion, right lobe 07/02/2011  . Cirrhosis of liver 07/02/2011    Finished Hep B Vaccines  . Low back pain     radiates to right side  . Thrombocytopenia   . Hepatitis C 07/02/2011    referred to Dr. Algis Liming for possible treatment of Hep C  . Anxiety   . Hypertension     per past medical records/faxed EKG to [id number] 10/31/11 with confirmation- old EKG from 2008 on chart  . Hepatocellular carcinoma 07/08/2011    referred to [facility] transplant clinic  . GERD (gastroesophageal reflux disease)     Past Surgical History  Procedure Date  . Liver biopsy 07/04/11    Done at [facility]  . Radiofrequency ablation liver tumor November 04, 2011    Dr. Fredia Sorrow    Current Outpatient Prescriptions  Medication Sig  Dispense Refill  . ALPRAZolam (XANAX) 1 MG tablet Take 1 mg by mouth 3 (three) times daily as needed.        Marland Kitchen lisinopril (PRINIVIL,ZESTRIL) 10 MG tablet Take 10 mg by mouth daily.       . Multiple Vitamins-Minerals (MULTIVITAMIN PO) Take 1 tablet by mouth daily.        . Oxycodone HCl 10 MG TABS Take 10 mg by mouth every 4 (four) hours as needed.          Allergies as of 12/12/2011 - Review Complete 12/12/2011  Allergen Reaction Noted  . No known drug allergy  10/25/2011    Family History  Problem Relation Age of Onset  . Cancer Father     lung  . Colon cancer Maternal Grandmother     Questionable, pt denies  . Wilson's disease Paternal Aunt     History   Social History  . Marital Status: Married    Spouse Name: N/A    Number of Children: N/A  . Years of Education: N/A   Occupational History  . Not on file.   Social History Main Topics  . Smoking status: Current Everyday Smoker -- 0.5 packs/day for 40 years    Types: Cigarettes  . Smokeless tobacco: Never Used   Comment: 1/2 pack a day x 40 yrs.  Marland Kitchen  Alcohol Use: No     Quit 2008 after drinking12-18 pack of beer a day x 35 yrs  . Drug Use: No     remote hx of marijuana  . Sexually Active: Not on file   Other Topics Concern  . Not on file   Social History Narrative  . No narrative on file    Review of Systems: Gen: Denies any fever, chills, loss of appetite, fatigue, weight loss. CV: Denies chest pain, heart palpitations, syncope, peripheral edema. Resp: Denies shortness of breath with rest, cough, wheezing GI: SEE HPI GU : Denies urinary burning, urinary frequency, urinary incontinence.  MS: Denies joint pain, muscle weakness, cramps, limited movement Derm: Denies rash, itching, dry skin Psych: Denies depression, anxiety, confusion or memory loss  Heme: Denies bruising, bleeding, and enlarged lymph nodes.  Physical Exam: BP 122/73  Pulse 104  Temp(Src) 97.7 F (36.5 C) (Temporal)  Ht 5\' 11"  (1.803 m)   Wt 131 lb 3.2 oz (59.512 kg)  BMI 18.30 kg/m2 General:   Alert and oriented. Thin but not cachectic appearing Head:  Normocephalic and atraumatic. Eyes:  Conjunctiva pink, sclera clear, no icterus.    Ears:  Normal auditory acuity. Nose:  No deformity, discharge,  or lesions. Mouth:  No deformity or lesions, mucosa pink and moist.  Neck:  Supple, without mass or thyromegaly. Lungs:  Clear to auscultation bilaterally, without wheezing, rales, or rhonchi.  Heart:  S1, S2 present without murmurs noted.  Abdomen:  +BS, soft, non-tender and non-distended. Without mass. Noted palpable spleen. No rebound or guarding. No hernias noted. Thin.  Rectal:  Deferred  Msk:  Symmetrical without gross deformities. Normal posture. Extremities:  Without clubbing or edema. Neurologic:  Alert and  oriented x4;  grossly normal neurologically. Skin:  Intact, warm and dry without significant lesions or rashes Cervical Nodes:  No significant cervical adenopathy. Psych:  Alert and cooperative. Normal mood and affect.

## 2011-12-12 NOTE — Patient Instructions (Signed)
We have set you up for a colonoscopy and upper endoscopy in the near future with Dr. Jena Gauss.  Further recommendations to follow once this is completed.  Have a [place] Christmas!

## 2011-12-12 NOTE — Telephone Encounter (Signed)
Pt has appt. With Dr. Jacqualine Mau @ [facility] on 12/13/11 @ 1:30. Pt is aware.

## 2011-12-13 ENCOUNTER — Encounter: Payer: Self-pay | Admitting: Gastroenterology

## 2011-12-13 NOTE — Assessment & Plan Note (Signed)
55 year old male with no prior colonoscopy, referred by Dr. Dalene Carrow. He denies any family history of colon cancer, although this is listed in his family history. Regardless, he has no lower GI symptoms such as change in bowel habits, rectal bleeding, abdominal pain. He has recently been diagnosed with [facility] and has undergone ablation this year. He will be seeing [facility] transplant clinic in the near future. Hx of Hep C cirrhosis, also referred for possible treatment.  He will need to undergo this procedure with Propofol secondary to hx of ETOH abuse and polypharmacy.  Proceed with TCS with Dr. Jena Gauss in near future: the risks, benefits, and alternatives have been discussed with the patient in detail. The patient states understanding and desires to proceed. Endoscopy to be performed as well due to hx of cirrhosis and no prior screening for esophageal varices.

## 2011-12-13 NOTE — Assessment & Plan Note (Signed)
Diagnosed this year, s/p ablation, followed by Hem/Onc. Referred to [facility] transplant clinic.

## 2011-12-13 NOTE — Assessment & Plan Note (Signed)
Referred to Dr. Algis Liming for possible treatment.

## 2011-12-13 NOTE — Assessment & Plan Note (Signed)
HCV cirrhosis, MELD score 9. Finished Hep B vaccinations. Due for AFP, [place] (or CT if indicated) in April 2013. Needs screening for esophageal varices. EGD to be performed at time of colonoscopy. This will be done in the OR with Propofol secondary to hx of ETOH abuse and polypharmacy.  Proceed with upper endoscopy in the near future with Dr. Jena Gauss. The risks, benefits, and alternatives have been discussed in detail with patient. They have stated understanding and desire to proceed.

## 2011-12-14 ENCOUNTER — Other Ambulatory Visit: Payer: Self-pay

## 2011-12-14 ENCOUNTER — Ambulatory Visit: Payer: Self-pay | Admitting: Vascular Surgery

## 2011-12-14 ENCOUNTER — Telehealth: Payer: Self-pay

## 2011-12-14 NOTE — Progress Notes (Signed)
Cc to PCP 

## 2011-12-14 NOTE — Telephone Encounter (Signed)
Called to inform pt appt for TCS and EGD is on 12/29/2011 @ 1:45. Pre-op appt is 12/26/2011 @ 11:00 AM. ( he will be informed at pre-op of the time he is to register on day of procedures. Rx and instructions faxed to [facility].

## 2011-12-21 ENCOUNTER — Encounter (HOSPITAL_COMMUNITY): Payer: Self-pay

## 2011-12-26 ENCOUNTER — Encounter (HOSPITAL_COMMUNITY)
Admission: RE | Admit: 2011-12-26 | Discharge: 2011-12-26 | Disposition: A | Discharge status: Home / Self Care | Payer: Medicaid Other | Source: Ambulatory Visit | Attending: Internal Medicine | Admitting: Internal Medicine

## 2011-12-26 ENCOUNTER — Encounter (HOSPITAL_COMMUNITY): Payer: Self-pay

## 2011-12-26 LAB — BASIC METABOLIC PANEL
Calcium: 9.5 mg/dL (ref 8.4–10.5)
Creatinine, Ser: 0.69 mg/dL (ref 0.50–1.35)
GFR calc Af Amer: >90 mL/min (ref >90)
GFR calc non Af Amer: >90 mL/min (ref >90)

## 2011-12-26 LAB — CBC
MCH: 32.9 pg (ref 26.0–34.0)
MCHC: 34.3 g/dL (ref 30.0–36.0)
MCV: 95.8 fL (ref 78.0–100.0)
Platelets: 34 10*3/uL — ABNORMAL LOW (ref 150–400)
RDW: 14.8 % (ref 11.5–15.5)
WBC: 2.3 10*3/uL — ABNORMAL LOW (ref 4.0–10.5)

## 2011-12-26 NOTE — Patient Instructions (Addendum)
20 Andrew White  12/26/2011   Your procedure is scheduled on:   12/29/3011  Report to [facility] at  1200  AM.  Call this number if you have problems the morning of surgery: [phone]   Remember:   Do not eat food:After Midnight.  May have clear liquids:until Midnight .  Clear liquids include soda, tea, black coffee, apple or grape juice, broth.  Take these medicines the morning of surgery with A SIP OF WATER: xanax,lisinopril, oxycodone   Do not wear jewelry, make-up or nail polish.  Do not wear lotions, powders, or perfumes. You may wear deodorant.  Do not shave 48 hours prior to surgery.  Do not bring valuables to the hospital.  Contacts, dentures or bridgework may not be worn into surgery.  Leave suitcase in the car. After surgery it may be brought to your room.  For patients admitted to the hospital, checkout time is 11:00 AM the day of discharge.   Patients discharged the day of surgery will not be allowed to drive home.  Name and phone number of your driver: family  Special Instructions: CHG Shower Use Special Wash: 1/2 bottle night before surgery and 1/2 bottle morning of surgery.   Please read over the following fact sheets that you were given: Pain Booklet, MRSA Information, Surgical Site Infection Prevention, Anesthesia Post-op Instructions and Care and Recovery After Surgery Esophagogastroduodenoscopy This is an endoscopic procedure (a procedure that uses a device like a flexible telescope) that allows your caregiver to view the upper stomach and small bowel. This test allows your caregiver to look at the esophagus. The esophagus carries food from your mouth to your stomach. They can also look at your duodenum. This is the first part of the small intestine that attaches to the stomach. This test is used to detect problems in the bowel such as ulcers and inflammation. PREPARATION FOR TEST Nothing to eat after midnight the day before the test. NORMAL FINDINGS Normal  esophagus, stomach, and duodenum. Ranges for normal findings may vary among different laboratories and hospitals. You should always check with your doctor after having lab work or other tests done to discuss the meaning of your test results and whether your values are considered within normal limits. MEANING OF TEST  Your caregiver will go over the test results with you and discuss the importance and meaning of your results, as well as treatment options and the need for additional tests if necessary. OBTAINING THE TEST RESULTS It is your responsibility to obtain your test results. Ask the lab or department performing the test when and how you will get your results. Document Released: 04/14/2005 Document Revised: 08/24/2011 Document Reviewed: 11/21/2008 [facility] Patient Information 2012 [place], [place].Colonoscopy A colonoscopy is an exam to evaluate your entire colon. In this exam, your colon is cleansed. A long fiberoptic tube is inserted through your rectum and into your colon. The fiberoptic scope (endoscope) is a long bundle of enclosed and very flexible fibers. These fibers transmit light to the area examined and send images from that area to your caregiver. Discomfort is usually minimal. You may be given a drug to help you sleep (sedative) during or prior to the procedure. This exam helps to detect lumps (tumors), polyps, inflammation, and areas of bleeding. Your caregiver may also take a small piece of tissue (biopsy) that will be examined under a microscope. LET YOUR CAREGIVER KNOW ABOUT:   Allergies to food or medicine.   Medicines taken, including vitamins, herbs, eyedrops, over-the-counter  medicines, and creams.   Use of steroids (by mouth or creams).   Previous problems with anesthetics or numbing medicines.   History of bleeding problems or blood clots.   Previous surgery.   Other health problems, including diabetes and kidney problems.   Possibility of pregnancy, if this  applies.  BEFORE THE PROCEDURE   A clear liquid diet may be required for 2 days before the exam.   Ask your caregiver about changing or stopping your regular medications.   Liquid injections (enemas) or laxatives may be required.   A large amount of electrolyte solution may be given to you to drink over a short period of time. This solution is used to clean out your colon.   You should be present 60 minutes prior to your procedure or as directed by your caregiver.  AFTER THE PROCEDURE   If you received a sedative or pain relieving medication, you will need to arrange for someone to drive you home.   Occasionally, there is a little blood passed with the first bowel movement. Do not be concerned.  FINDING OUT THE RESULTS OF YOUR TEST Not all test results are available during your visit. If your test results are not back during the visit, make an appointment with your caregiver to find out the results. Do not assume everything is normal if you have not heard from your caregiver or the medical facility. It is important for you to follow up on all of your test results. HOME CARE INSTRUCTIONS   It is not unusual to pass moderate amounts of gas and experience mild abdominal cramping following the procedure. This is due to air being used to inflate your colon during the exam. Walking or a warm pack on your belly (abdomen) may help.   You may resume all normal meals and activities after sedatives and medicines have worn off.   Only take over-the-counter or prescription medicines for pain, discomfort, or fever as directed by your caregiver. Do not use aspirin or blood thinners if a biopsy was taken. Consult your caregiver for medicine usage if biopsies were taken.  SEEK IMMEDIATE MEDICAL CARE IF:   You have a fever.   You pass large blood clots or fill a toilet with blood following the procedure. This may also occur 10 to 14 days following the procedure. This is more likely if a biopsy was taken.    You develop abdominal pain that keeps getting worse and cannot be relieved with medicine.  Document Released: 12/09/2000 Document Revised: 08/24/2011 Document Reviewed: 07/24/2008 [facility] Patient Information 2012 [place], [place].PATIENT INSTRUCTIONS POST-ANESTHESIA  IMMEDIATELY FOLLOWING SURGERY:  Do not drive or operate machinery for the first twenty four hours after surgery.  Do not make any important decisions for twenty four hours after surgery or while taking narcotic pain medications or sedatives.  If you develop intractable nausea and vomiting or a severe headache please notify your doctor immediately.  FOLLOW-UP:  Please make an appointment with your surgeon as instructed. You do not need to follow up with anesthesia unless specifically instructed to do so.  WOUND CARE INSTRUCTIONS (if applicable):  Keep a dry clean dressing on the anesthesia/puncture wound site if there is drainage.  Once the wound has quit draining you may leave it open to air.  Generally you should leave the bandage intact for twenty four hours unless there is drainage.  If the epidural site drains for more than 36-48 hours please call the anesthesia department.  QUESTIONS?:  Please feel  free to call your physician or the hospital operator if you have any questions, and they will be happy to assist you.     [facility] Anesthesia Department [address] [place] [phone]

## 2011-12-28 NOTE — Progress Notes (Signed)
Pt seen in preop PLT CT 34. TCS/EGD CANCELLED AND RSC FOR JAN 10 TO ALLOW DR. Jena Gauss TO REVIEW THE CASE.

## 2011-12-28 NOTE — Pre-Procedure Instructions (Signed)
Upon reviewing pt's chart this morning for surgery tomorrow, platelet count from pre-op interview on 12/25/12 was 34,000. Pt is scheduled for EGD and TCS with Dr. Jena Gauss tomorrow 12/29/11. Dr. Jayme Cloud was notified of lab work and he said to notify Dr. Jena Gauss of lab work d/t risk of bleeding. Dr. Jena Gauss is out of town today, therefore his partner, Dr. Darrick Penna, was notified. Dr. Darrick Penna gave verbal order to reschedule pt's procedure for next Thursday, Jan. 10 d/t low platelet count of 34,000 (Dr. Darrick Penna is aware of pt's history of CA). Florian Buff, OR/Endo scheduler was notified and pt was notified and was put in contact with Selena Batten to reschedule his procedure for next Thursday, Jan. 10. We will notify Dr. Jena Gauss of pt's situation tomorrow morning when he arrives for his regularly scheduled procedures and ask if/when he wants pt to return to repeat lab work before procedure per Dr. Darrick Penna verbal order.

## 2011-12-29 ENCOUNTER — Other Ambulatory Visit: Payer: Self-pay | Admitting: Internal Medicine

## 2011-12-29 DIAGNOSIS — D691 Qualitative platelet defects: Secondary | ICD-10-CM

## 2011-12-29 NOTE — Progress Notes (Signed)
Quick Note:  Spoke with pts daughter Dorathy Daft- she wrote it down on pts calender to have blood work done on Monday. Lab order faxed to lab. Pt is scheduled for 01/04/11 for tcs/egd ______

## 2012-01-02 ENCOUNTER — Other Ambulatory Visit: Payer: Self-pay | Admitting: Internal Medicine

## 2012-01-03 ENCOUNTER — Other Ambulatory Visit: Payer: Self-pay | Admitting: Gastroenterology

## 2012-01-03 LAB — CBC WITH DIFFERENTIAL/PLATELET
Hemoglobin: 15.5 g/dL (ref 13.0–17.0)
Lymphocytes Relative: 19 % (ref 12–46)
Lymphs Abs: 0.6 10*3/uL — ABNORMAL LOW (ref 0.7–4.0)
MCH: 34.2 pg — ABNORMAL HIGH (ref 26.0–34.0)
Monocytes Relative: 9 % (ref 3–12)
Neutro Abs: 2.1 10*3/uL (ref 1.7–7.7)
Neutrophils Relative %: 71 % (ref 43–77)
RBC: 4.53 MIL/uL (ref 4.22–5.81)
WBC: 3 10*3/uL — ABNORMAL LOW (ref 4.0–10.5)

## 2012-01-03 MED ORDER — PEG 3350-KCL-NA BICARB-NACL 420 G PO SOLR: ORAL | Status: AC

## 2012-01-03 NOTE — Progress Notes (Signed)
Quick Note:  Noted. Repeat Plts improved to 43. ______

## 2012-01-05 ENCOUNTER — Ambulatory Visit (HOSPITAL_COMMUNITY)
Admission: RE | Admit: 2012-01-05 | Discharge: 2012-01-05 | Disposition: A | Discharge status: Home / Self Care | Payer: Medicaid Other | Source: Ambulatory Visit | Attending: Internal Medicine | Admitting: Internal Medicine

## 2012-01-05 ENCOUNTER — Encounter (HOSPITAL_COMMUNITY): Payer: Self-pay | Admitting: Anesthesiology

## 2012-01-05 ENCOUNTER — Other Ambulatory Visit: Payer: Self-pay | Admitting: Internal Medicine

## 2012-01-05 ENCOUNTER — Encounter (HOSPITAL_COMMUNITY): Payer: Self-pay | Admitting: *Deleted

## 2012-01-05 ENCOUNTER — Encounter (HOSPITAL_COMMUNITY)
Admission: RE | Disposition: A | Discharge status: Home / Self Care | Payer: Self-pay | Source: Ambulatory Visit | Attending: Internal Medicine

## 2012-01-05 DIAGNOSIS — D126 Benign neoplasm of colon, unspecified: Secondary | ICD-10-CM

## 2012-01-05 DIAGNOSIS — Z1211 Encounter for screening for malignant neoplasm of colon: Secondary | ICD-10-CM

## 2012-01-05 DIAGNOSIS — I10 Essential (primary) hypertension: Secondary | ICD-10-CM | POA: Insufficient documentation

## 2012-01-05 DIAGNOSIS — K5730 Diverticulosis of large intestine without perforation or abscess without bleeding: Secondary | ICD-10-CM

## 2012-01-05 DIAGNOSIS — K319 Disease of stomach and duodenum, unspecified: Secondary | ICD-10-CM

## 2012-01-05 DIAGNOSIS — Z01812 Encounter for preprocedural laboratory examination: Secondary | ICD-10-CM | POA: Insufficient documentation

## 2012-01-05 DIAGNOSIS — K31819 Angiodysplasia of stomach and duodenum without bleeding: Secondary | ICD-10-CM

## 2012-01-05 DIAGNOSIS — Z79899 Other long term (current) drug therapy: Secondary | ICD-10-CM | POA: Insufficient documentation

## 2012-01-05 DIAGNOSIS — I8500 Esophageal varices without bleeding: Secondary | ICD-10-CM

## 2012-01-05 SURGERY — COLONOSCOPY WITH PROPOFOL
Anesthesia: Monitor Anesthesia Care | Site: Throat

## 2012-01-05 MED ORDER — LIDOCAINE HCL (CARDIAC) 10 MG/ML IV SOLN
INTRAVENOUS | Status: DC | PRN
  Administered 2012-01-05: 10 mg via INTRAVENOUS

## 2012-01-05 MED ORDER — FENTANYL CITRATE 0.05 MG/ML IJ SOLN: 25.0000 ug | INTRAMUSCULAR | Status: DC | PRN

## 2012-01-05 MED ORDER — GLYCOPYRROLATE 0.2 MG/ML IJ SOLN
INTRAMUSCULAR | Status: AC
  Administered 2012-01-05: 0.2 mg via INTRAVENOUS
  Filled 2012-01-05: qty 1

## 2012-01-05 MED ORDER — FENTANYL CITRATE 0.05 MG/ML IJ SOLN
INTRAMUSCULAR | Status: DC | PRN
  Administered 2012-01-05 (×2): 50 ug via INTRAVENOUS

## 2012-01-05 MED ORDER — LIDOCAINE HCL (PF) 1 % IJ SOLN
INTRAMUSCULAR | Status: AC
  Filled 2012-01-05: qty 5

## 2012-01-05 MED ORDER — BUTAMBEN-TETRACAINE-BENZOCAINE 2-2-14 % EX AERO
1.0000 | INHALATION_SPRAY | Freq: Once | CUTANEOUS | Status: AC
  Administered 2012-01-05: 1 via TOPICAL

## 2012-01-05 MED ORDER — LACTATED RINGERS IV SOLN
INTRAVENOUS | Status: DC
  Administered 2012-01-05: 09:00:00 via INTRAVENOUS

## 2012-01-05 MED ORDER — MIDAZOLAM HCL 2 MG/2ML IJ SOLN
INTRAMUSCULAR | Status: AC
  Administered 2012-01-05: 2 mg via INTRAVENOUS
  Filled 2012-01-05: qty 2

## 2012-01-05 MED ORDER — PROPOFOL 10 MG/ML IV EMUL
INTRAVENOUS | Status: DC | PRN
  Administered 2012-01-05: 100 ug/kg/min via INTRAVENOUS

## 2012-01-05 MED ORDER — ONDANSETRON HCL 4 MG/2ML IJ SOLN: 4.0000 mg | Freq: Once | INTRAMUSCULAR | Status: DC | PRN

## 2012-01-05 MED ORDER — MIDAZOLAM HCL 2 MG/2ML IJ SOLN
1.0000 mg | INTRAMUSCULAR | Status: DC | PRN
  Administered 2012-01-05: 2 mg via INTRAVENOUS

## 2012-01-05 MED ORDER — GLYCOPYRROLATE 0.2 MG/ML IJ SOLN
0.2000 mg | Freq: Once | INTRAMUSCULAR | Status: AC
  Administered 2012-01-05: 0.2 mg via INTRAVENOUS

## 2012-01-05 MED ORDER — PROPOFOL 10 MG/ML IV EMUL
INTRAVENOUS | Status: AC
  Filled 2012-01-05: qty 20

## 2012-01-05 MED ORDER — WATER FOR IRRIGATION, STERILE IR SOLN
Status: DC | PRN
  Administered 2012-01-05: 100 mL via RECTAL

## 2012-01-05 MED ORDER — FENTANYL CITRATE 0.05 MG/ML IJ SOLN
INTRAMUSCULAR | Status: AC
  Filled 2012-01-05: qty 2

## 2012-01-05 MED ORDER — STERILE WATER FOR IRRIGATION IR SOLN
Status: DC | PRN
  Administered 2012-01-05: 09:00:00

## 2012-01-05 SURGICAL SUPPLY — 26 items
BLOCK BITE 60FR ADLT L/F BLUE (MISCELLANEOUS) ×3 IMPLANT
DEVICE CLIP HEMOSTAT 235CM (CLIP) ×1 IMPLANT
ELECT REM PT RETURN 9FT ADLT (ELECTROSURGICAL)
ELECTRODE REM PT RTRN 9FT ADLT (ELECTROSURGICAL) IMPLANT
FCP BXJMBJMB 240X2.8X (CUTTING FORCEPS)
FLOOR PAD 36X40 (MISCELLANEOUS) ×3
FORCEP RJ3 GP 1.8X160 W-NEEDLE (CUTTING FORCEPS) ×3 IMPLANT
FORCEPS BIOP RAD 4 LRG CAP 4 (CUTTING FORCEPS) ×2 IMPLANT
FORCEPS BIOP RJ4 240 W/NDL (CUTTING FORCEPS)
FORCEPS BXJMBJMB 240X2.8X (CUTTING FORCEPS) IMPLANT
INJECTOR/SNARE I SNARE (MISCELLANEOUS) IMPLANT
LUBRICANT JELLY 4.5OZ STERILE (MISCELLANEOUS) ×1 IMPLANT
MANIFOLD NEPTUNE II (INSTRUMENTS) ×1 IMPLANT
MANIFOLD NEPTUNE WASTE (CANNULA) ×3 IMPLANT
NDL SCLEROTHERAPY 25GX240 (NEEDLE) ×2 IMPLANT
NEEDLE SCLEROTHERAPY 25GX240 (NEEDLE) ×3 IMPLANT
PAD FLOOR 36X40 (MISCELLANEOUS) ×2 IMPLANT
PROBE APC STR FIRE (PROBE) ×3 IMPLANT
PROBE INJECTION GOLD (MISCELLANEOUS) ×3
PROBE INJECTION GOLD 7FR (MISCELLANEOUS) ×2 IMPLANT
SNARE ROTATE MED OVAL 20MM (MISCELLANEOUS) ×3 IMPLANT
SYR 50ML LL SCALE MARK (SYRINGE) ×1 IMPLANT
TRAP SPECIMEN MUCOUS 40CC (MISCELLANEOUS) IMPLANT
TUBING ENDO SMARTCAP PENTAX (MISCELLANEOUS) ×3 IMPLANT
TUBING IRRIGATION ENDOGATOR (MISCELLANEOUS) ×3 IMPLANT
WATER STERILE IRR 1000ML POUR (IV SOLUTION) IMPLANT

## 2012-01-05 NOTE — Anesthesia Preprocedure Evaluation (Signed)
Anesthesia Evaluation  Patient identified by MRN, date of birth, ID band Patient awake  General Assessment Comment:Infection precautions  Reviewed: Allergy & Precautions, H&P , NPO status , Patient's Chart, lab work & pertinent test results, reviewed documented beta blocker date and time   Airway Mallampati: II TM Distance: >3 FB Neck ROM: Full    Dental  (+) Partial Upper   Pulmonary Current Smoker,  clear to auscultation        Cardiovascular hypertension, Pt. 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   Anesthesia Other Findings   Reproductive/Obstetrics negative OB ROS                           Anesthesia Physical Anesthesia Plan  ASA: IV  Anesthesia Plan: MAC   Post-op Pain Management:    Induction: Intravenous  Airway Management Planned: Simple Face Mask  Additional Equipment:   Intra-op Plan:   Post-operative Plan:   Informed Consent: I have reviewed the patients History and Physical, chart, labs and discussed the procedure including the risks, benefits and alternatives for the proposed anesthesia with the patient or authorized representative who has indicated his/her understanding and acceptance.     Plan Discussed with:   Anesthesia Plan Comments:         Anesthesia Quick Evaluation

## 2012-01-05 NOTE — Progress Notes (Signed)
From or. Awake. Swallowing without difficulty. Encouraged to pass air. Passing air without difficulty. Voices no c/o at this time.

## 2012-01-05 NOTE — Op Note (Signed)
[facility] [address] [place], [place]  [address]  COLONOSCOPY PROCEDURE REPORT  PATIENT:  Andrew White, Andrew White  MR#:  [id number] BIRTHDATE:  03-02-1956, 55 yrs. old  GENDER:  male ENDOSCOPIST:  R. Roetta Sessions, MD FACP [facility] REF. BY:          Dr. Yetta Numbers PROCEDURE DATE:  01/05/2012 PROCEDURE:  colonoscopy multiple snare polypectomies  INDICATIONS:  average risk colorectal cancer screening  INFORMED CONSENT:  The risks, benefits, alternatives and imponderables including but not limited to bleeding, perforation as well as the possibility of a missed lesion have been reviewed. The potential for biopsy, lesion removal, etc. have also been discussed.  Questions have been answered.  All parties agreeable. Please see the history and physical in the medical record for more information.  MEDICATIONS:  deep sedation per Dr. [facility]  DESCRIPTION OF PROCEDURE:  After a digital rectal exam was performed, the  colonoscope was advanced from the anus through the rectum and colon to the area of the cecum, ileocecal valve and appendiceal orifice.  The cecum was deeply intubated.  These structures were well-seen and photographed for the record.  From the level of the cecum and ileocecal valve, the scope was slowly and cautiously withdrawn.  The mucosal surfaces were carefully surveyed utilizing scope tip deflection to facilitate fold flattening as needed.  The scope was pulled down into the rectum where a thorough examination including retroflexion was performed. <<PROCEDUREIMAGES>>  FINDINGS:  adequate preparation. Multiple colonic polyps in the hepatic flexure descending and sigmoid segment. Sigmoid diverticula;       ascending colon congestion consistent with portal colopathy  THERAPEUTIC / DIAGNOSTIC MANEUVERS PERFORMED:  all polyps hot snare removed. The largest polyp was a 1.25 cm pedunculated polyp in the sigmoid segment. The stalk was clipped with one  resolution clip. Subsequently ,this polyp was removed cleanly with one pass. Hemostasis maintained with all polyps removed.  COMPLICATIONS:  none  CECAL WITHDRAWAL TIME:19 minutes  IMPRESSION:  Multiple colonic polyps removed as described above. Sigmoid diverticulosis. Congestive ascending colon mucosa consistent with portal colopathy  RECOMMENDATIONS:     Followup on pathology. Continue to refrain from aspirin or nonsteroidal agents. No MRI the future to a clip known to have passed  ______________________________ R. Roetta Sessions, MD [facility]  CC:  Karleen Hampshire, MDLauretta Odogwu, MD  n. eSIGNED:   R. Roetta Sessions at 01/05/2012 10:37 AM  Isabelle Course, [id number]

## 2012-01-05 NOTE — Anesthesia Postprocedure Evaluation (Signed)
Anesthesia Post Note  Patient: Andrew White  Procedure(s) Performed:  COLONOSCOPY WITH PROPOFOL - In cecum@ 0911 withdrawal time .; ESOPHAGOGASTRODUODENOSCOPY (EGD) WITH PROPOFOL  Anesthesia type: MAC  Patient location: PACU  Post pain: Pain level controlled  Post assessment: Post-op Vital signs reviewed, Patient's Cardiovascular Status Stable, Respiratory Function Stable, Patent Airway, No signs of Nausea or vomiting and Pain level controlled  Last Vitals:  Filed Vitals:   01/05/12 0938  BP: 119/75  Pulse: 107  Temp: 36.9 C  Resp: 21    Post vital signs: Reviewed and stable  Level of consciousness: awake and alert   Complications: No apparent anesthesia complications

## 2012-01-05 NOTE — Transfer of Care (Deleted)
Anesthesia Post Note  Patient: Andrew White  Procedure(s) Performed:  COLONOSCOPY WITH PROPOFOL - In cecum@ 0911 withdrawal time 19min.; ESOPHAGOGASTRODUODENOSCOPY (EGD) WITH PROPOFOL  Anesthesia type: MAC  Patient location: PACU  Post pain: Pain level controlled  Post assessment: Post-op Vital signs reviewed, Patient's Cardiovascular Status Stable, Respiratory Function Stable, Patent Airway, No signs of Nausea or vomiting and Pain level controlled  Last Vitals:  Filed Vitals:   01/05/12 0938  BP: 119/75  Pulse: 107  Temp: 36.9 C  Resp: 21    Post vital signs: Reviewed and stable  Level of consciousness: awake and alert   Complications: No apparent anesthesia complications   

## 2012-01-05 NOTE — Anesthesia Procedure Notes (Signed)
Procedure Name: MAC Date/Time: 01/05/2012 8:45 AM Performed by: Minerva Areola Pre-anesthesia Checklist: Patient identified, Patient being monitored, Emergency Drugs available, Timeout performed and Suction available Patient Re-evaluated:Patient Re-evaluated prior to inductionOxygen Delivery Method: Simple face mask

## 2012-01-05 NOTE — Transfer of Care (Signed)
Immediate Anesthesia Transfer of Care Note  Patient: Andrew White  Procedure(s) Performed:  COLONOSCOPY WITH PROPOFOL - In cecum@ 0911 withdrawal time .; ESOPHAGOGASTRODUODENOSCOPY (EGD) WITH PROPOFOL  Patient Location: PACU  Anesthesia Type: MAC  Level of Consciousness: awake  Airway & Oxygen Therapy: Patient Spontanous Breathing. Nasal cannula  Post-op Assessment: Report given to PACU RN, Post -op Vital signs reviewed and stable and Patient moving all extremities  Post vital signs: Reviewed and stable  Complications: No apparent anesthesia complications

## 2012-01-05 NOTE — H&P (Signed)
  I have seen & examined the patient prior to the procedure(s) today and reviewed the history and physical/consultation.  There have been no changes.  After consideration of the risks, benefits, alternatives and imponderables, the patient has consented to the procedure(s). \ Follow-up plt count 43k; no transfusion planned.

## 2012-01-09 ENCOUNTER — Encounter: Payer: Self-pay | Admitting: Internal Medicine

## 2012-01-10 ENCOUNTER — Encounter: Payer: Self-pay | Admitting: Vascular Surgery

## 2012-01-11 ENCOUNTER — Ambulatory Visit: Payer: Self-pay | Admitting: Vascular Surgery

## 2012-01-11 ENCOUNTER — Other Ambulatory Visit: Payer: Self-pay

## 2012-01-19 ENCOUNTER — Encounter: Payer: Self-pay | Admitting: Internal Medicine

## 2012-02-28 ENCOUNTER — Other Ambulatory Visit: Payer: Medicaid Other | Admitting: Lab

## 2012-02-28 ENCOUNTER — Ambulatory Visit: Payer: Medicaid Other | Admitting: Hematology and Oncology

## 2012-05-24 ENCOUNTER — Telehealth: Payer: Self-pay | Admitting: Radiology

## 2012-05-24 NOTE — Telephone Encounter (Signed)
Left message requesting pt call for update re:  Consult at [facility] re: possible liver transplant.

## 2013-02-01 IMAGING — CT CT NECK W/ CM
3 of 11 series · 9 of 33 positions shown, 10 images · IV contrast (Omnipaque 300)
Comparison: None

CLINICAL DATA: Left anterior cervical lymphadenopathy.

CT NECK WITH CONTRAST
TECHNIQUE: Multidetector CT imaging of the neck was performed with
intravenous contrast.
Contrast: 100 ml 1mnipaque-CZZ

[Series 2: soft tissue neck 2.0 b31s · axial · 0.38mm/px · z∈[+504,+572]mm · 2 of 102 slices shown]
[im 34/102  soft-tissue]
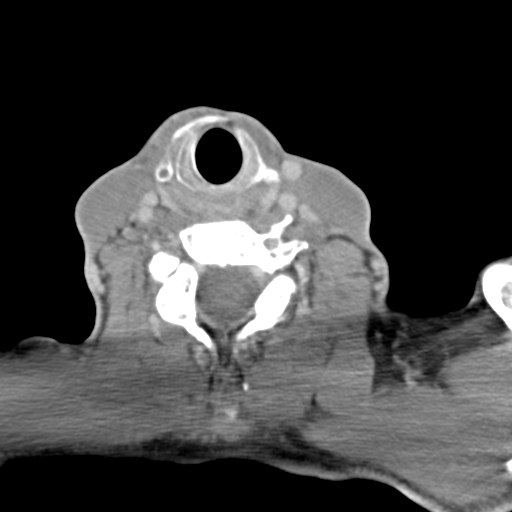
[im 68/102  soft-tissue]
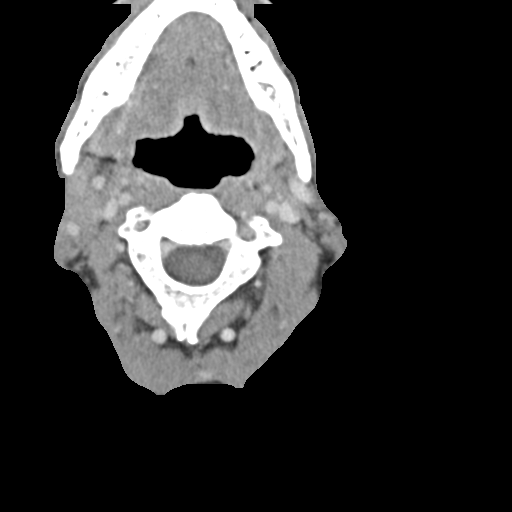

[Series 8: axial angled to hyoid st neck 2.0 · axial · 0.37mm/px · z∈[+469,+536]mm · 2 of 109 slices shown, 3 images]
[im 37/109  soft-tissue]
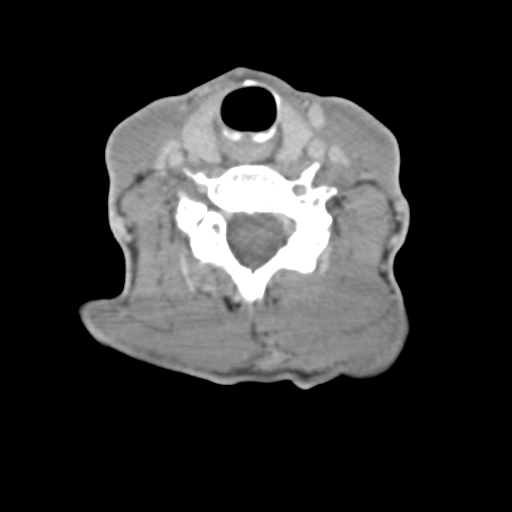
[im 37/109  bone]
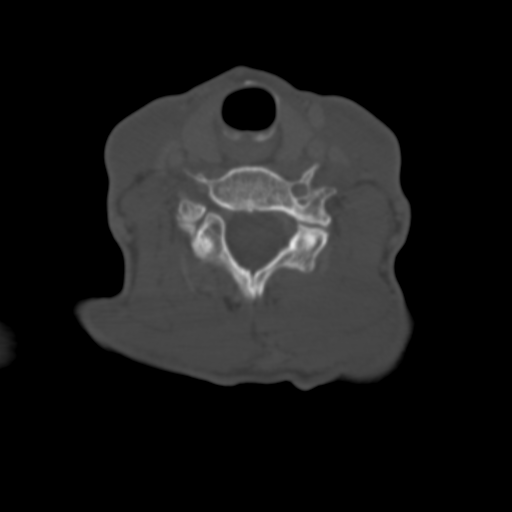
[im 73/109  bone]
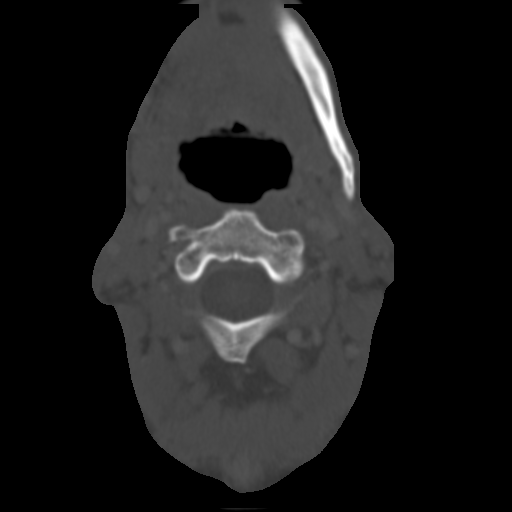

[Series 11: neck 2.0 soft tissue sag · sagittal · 0.17mm/px · 5 of 95 slices shown]
[im 14/95  bone]
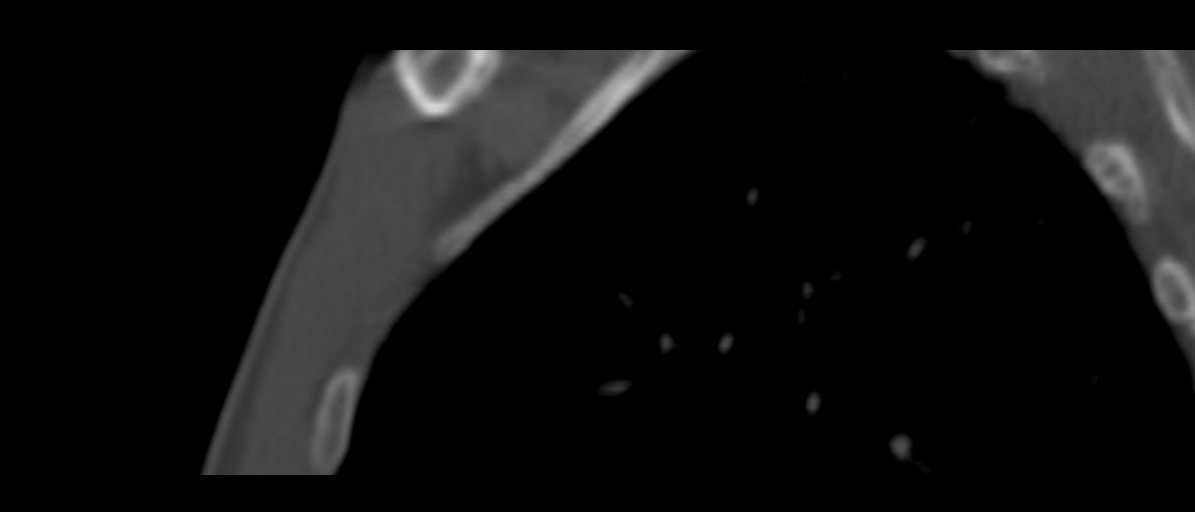
[im 27/95  bone]
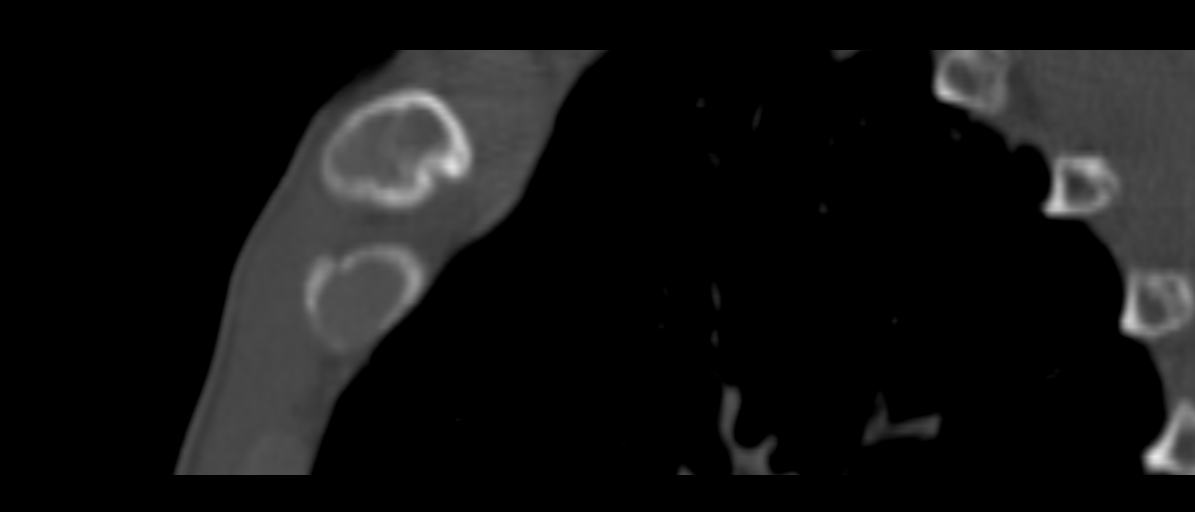
[im 41/95  bone]
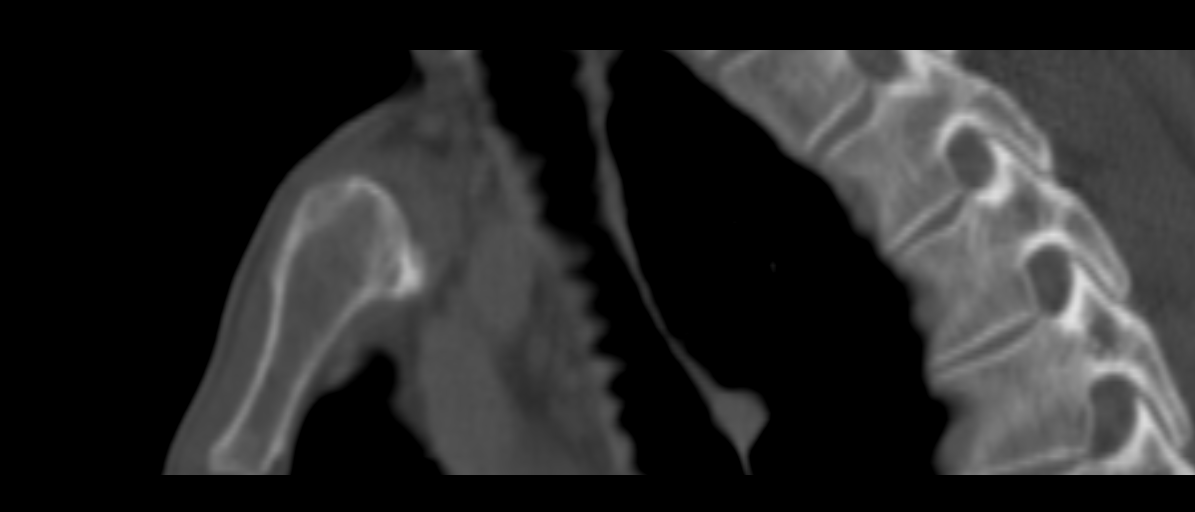
[im 54/95  bone]
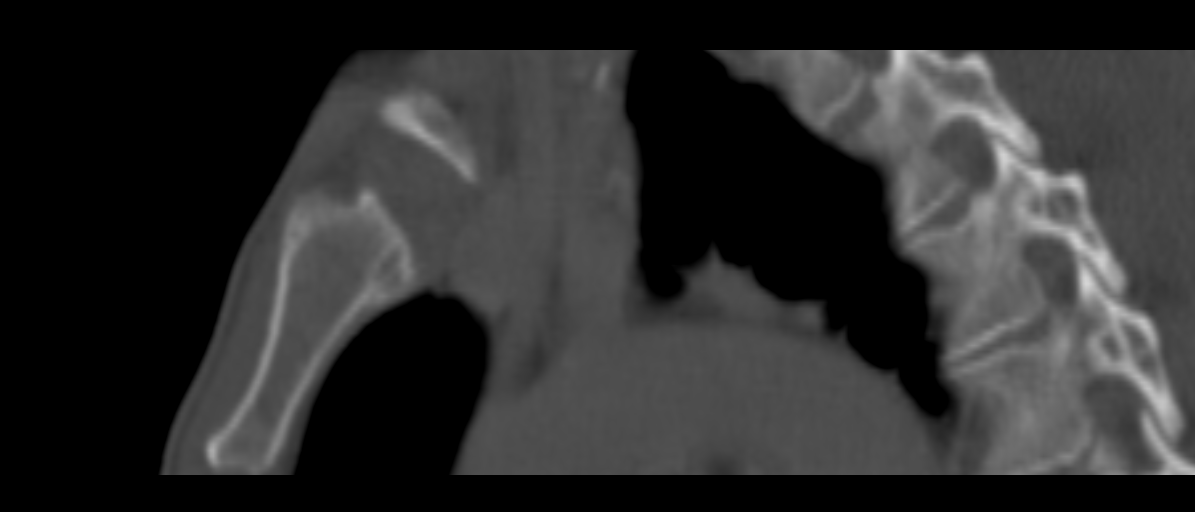
[im 68/95  bone]
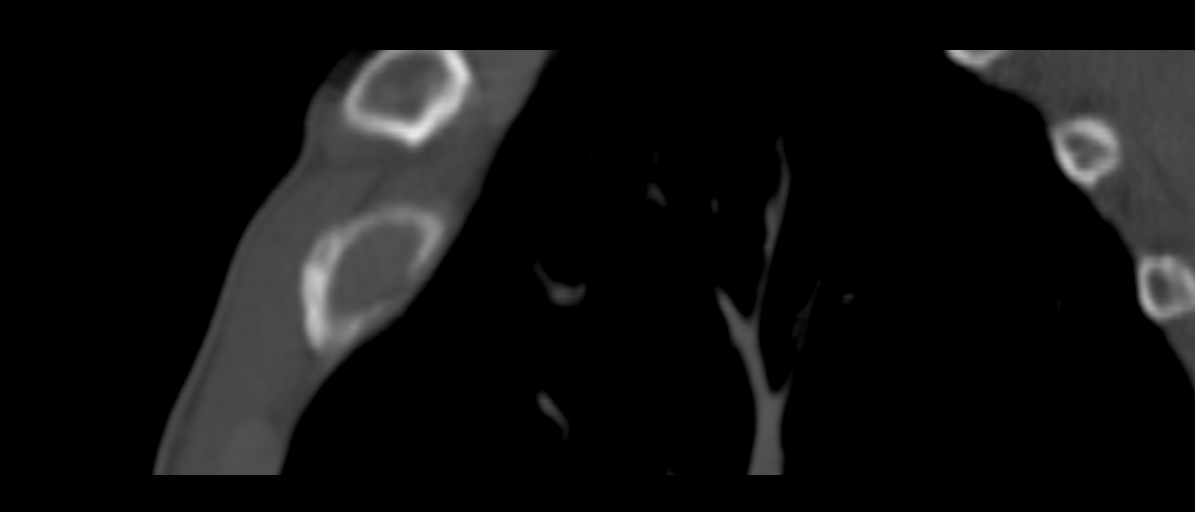

[9 of 33 positions shown; findings below may reference images not displayed]

FINDINGS: Lung apices show emphysematous change with pleural and
parenchymal scarring.  Limited visualization of the intracranial
contents does not show any abnormality.

Parotid glands appear normal.  Submandibular glands appear normal.
Thyroid gland appears normal.

The skin marker on the left does not overlie any definable mass
lesion.  I do not see any enlarged lymph nodes on either side of
the neck.  Markers seems to correlate with the region of the
carotid bifurcation, in which there is some bulky calcification,
though it appears similar to the other side.  The study was not
done in the form of a CT angiogram and the stenosis is not
precisely characterized, but I think there may be narrowing at the
distal ICA level approaching 50%.

There is ordinary degenerative spondylosis in the mid cervical
region.  The patient has chronic bilateral pars defects of C6 with
chronic anterolisthesis of 4 mm.

No mucosal or submucosal lesion is seen.
IMPRESSION: The area of concern does not overlie any definable mass or enlarged
lymph node.  This is in the region of the carotid bifurcation which
shows some bulky calcification and could possibly be palpable.  See
above discussion.

Bilateral pars defects at C6 with anterolisthesis of 4 mm.

## 2013-02-01 IMAGING — CT CT ABDOMEN WO/W CM
3 of 8 series · 12 of 46 positions shown, 18 images · IV contrast (Omnipaque 300)
Comparison: None.

CLINICAL DATA: Cirrhosis, hepatitis C, splenomegaly, weakness

CT ABDOMEN WITHOUT AND WITH CONTRAST
TECHNIQUE: Multidetector CT imaging of the abdomen was performed
following the standard protocol before and during bolus
administration of intravenous contrast. Sagittal and coronal MPR
images reconstructed from axial data set.
Contrast: Dilute oral contrast, 100 ml Omnipaque 300 IV

[Series 3: arterial 25 sec 3.0 b40f · axial · arterial · 0.66mm/px · z∈[+48,+116]mm · 3 of 82 slices shown]
[im 12/82  soft-tissue]
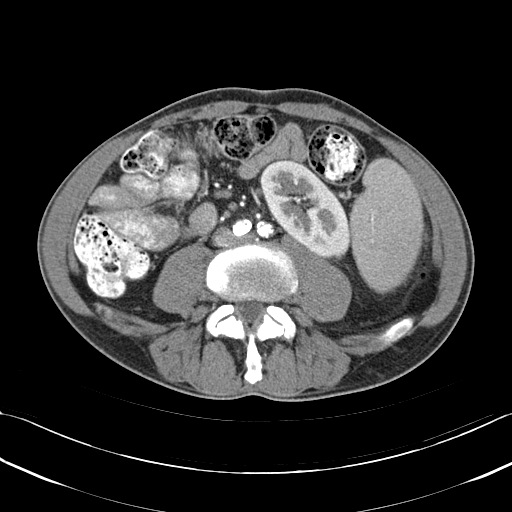
[im 24/82  soft-tissue]
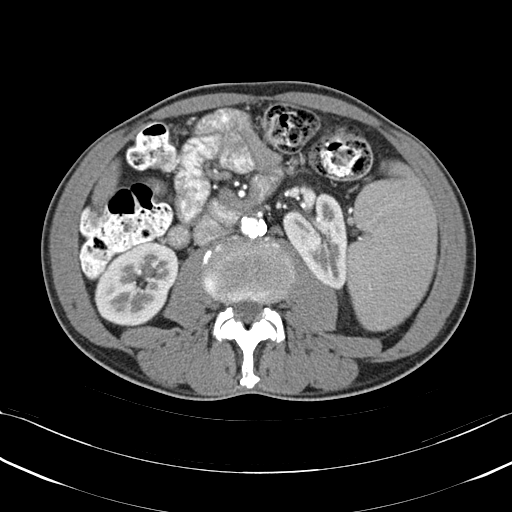
[im 35/82  soft-tissue]
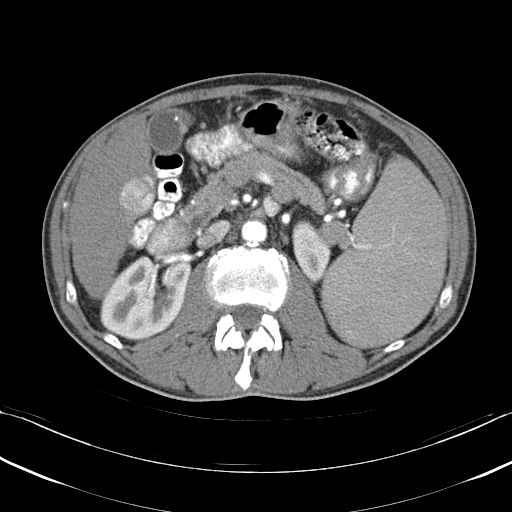

[Series 4: mpr arterial cor (id) · coronal · arterial · 0.50mm/px · 3 of 74 slices shown, 4 images]
[im 19/74  soft-tissue]
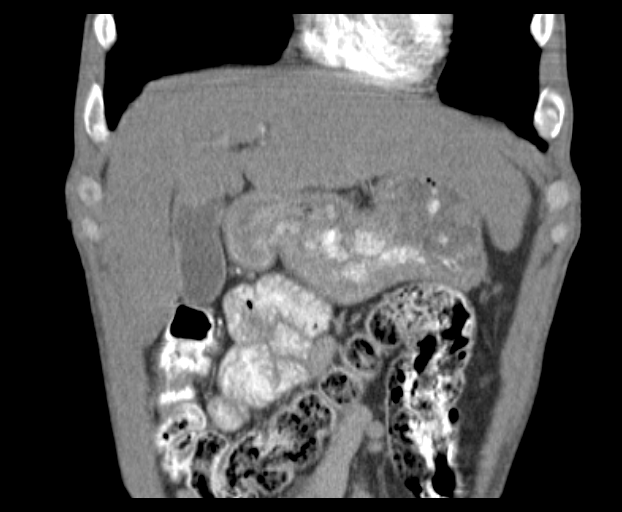
[im 37/74  soft-tissue]
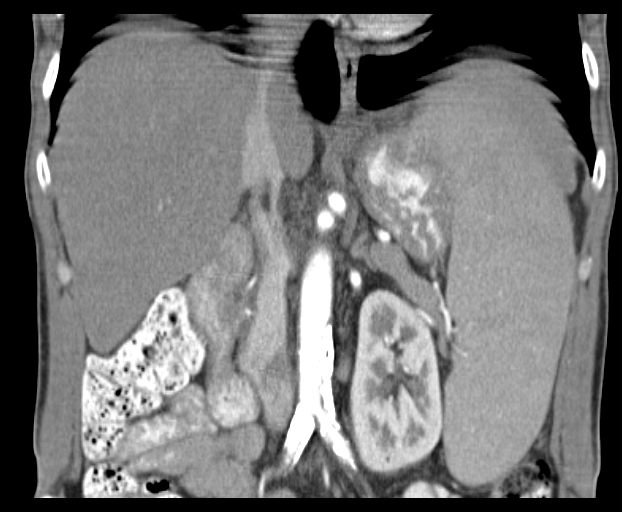
[im 37/74  bone]
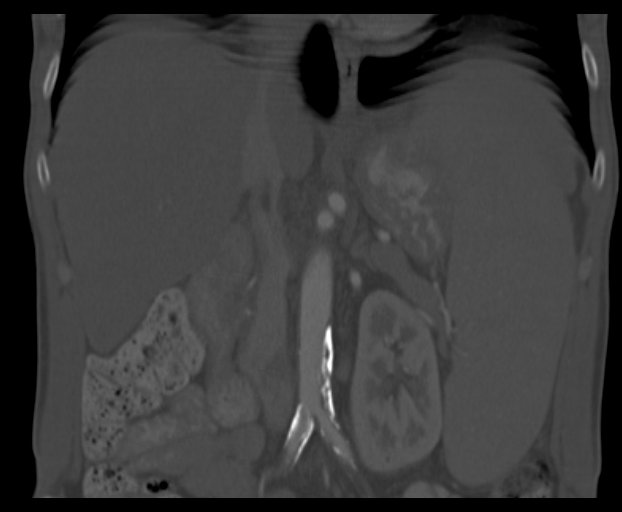
[im 55/74  soft-tissue]
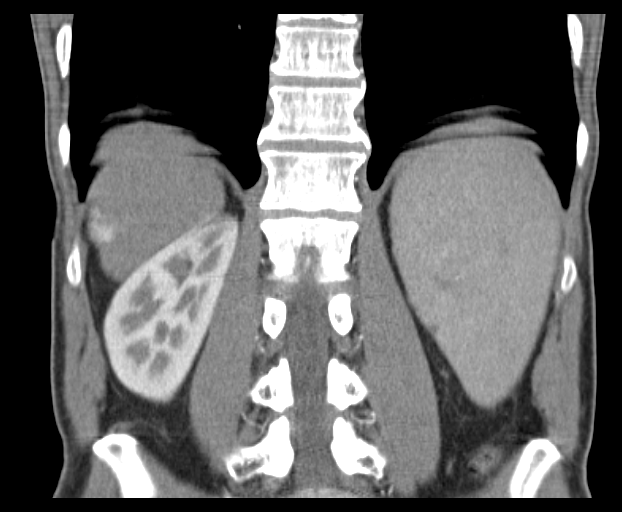

[Series 7: venous 60 sec 3.0 b40f · axial · portal-venous · 0.66mm/px · z∈[+50,+222]mm · 6 of 81 slices shown, 11 images]
[im 12/81  soft-tissue]
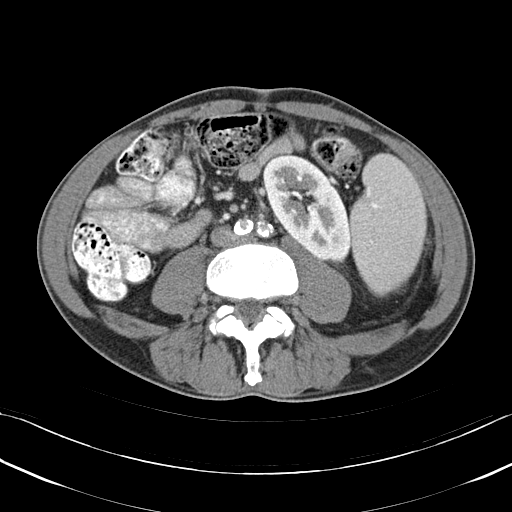
[im 12/81  bone]
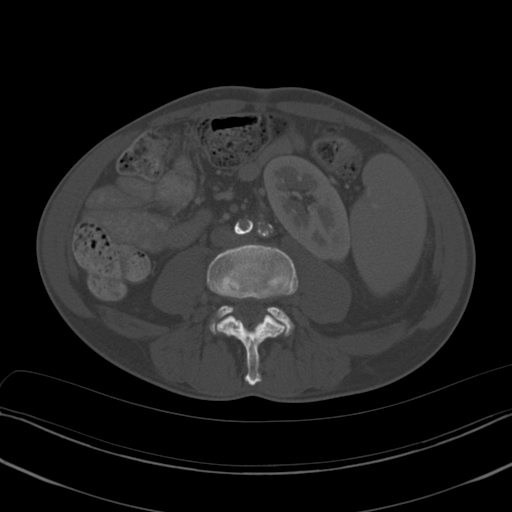
[im 23/81  soft-tissue]
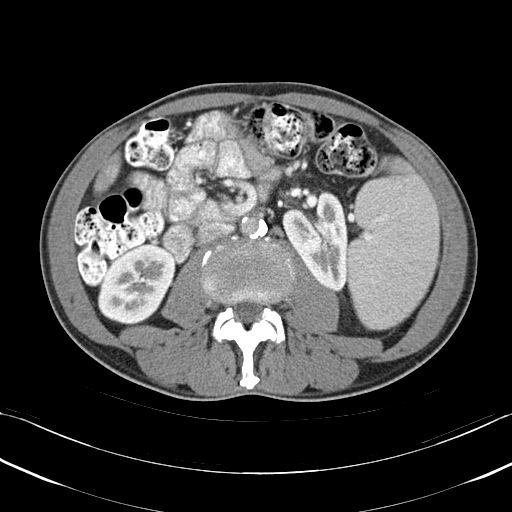
[im 35/81  soft-tissue]
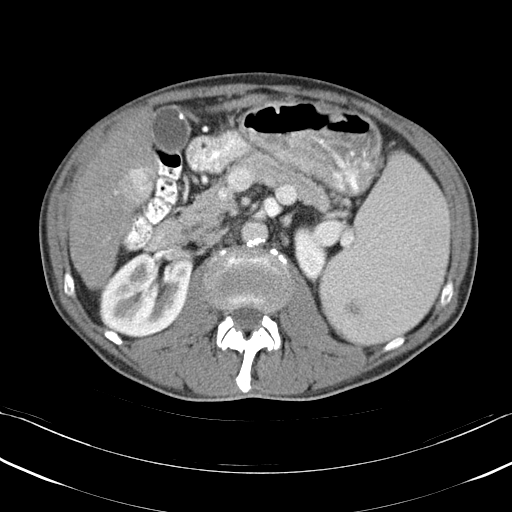
[im 35/81  lung]
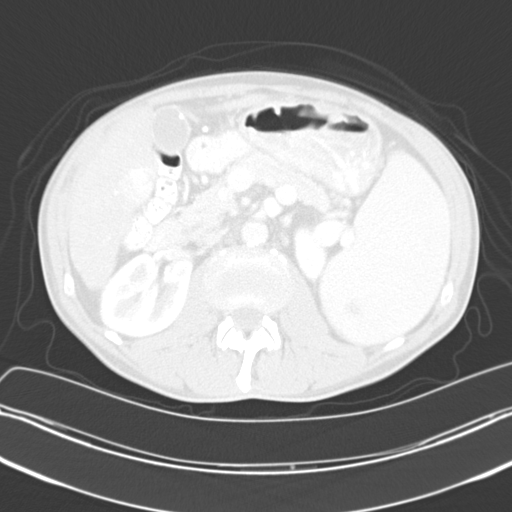
[im 46/81  soft-tissue]
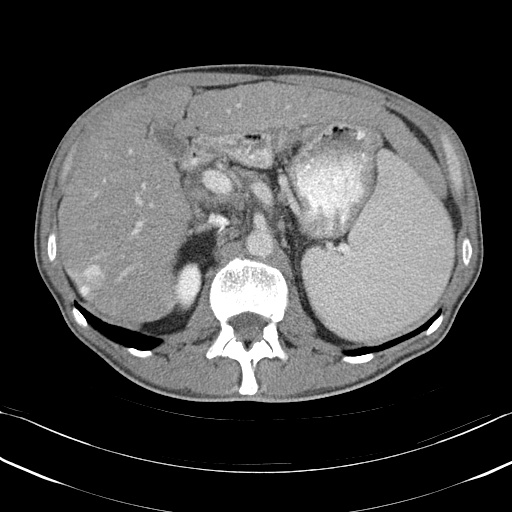
[im 46/81  lung]
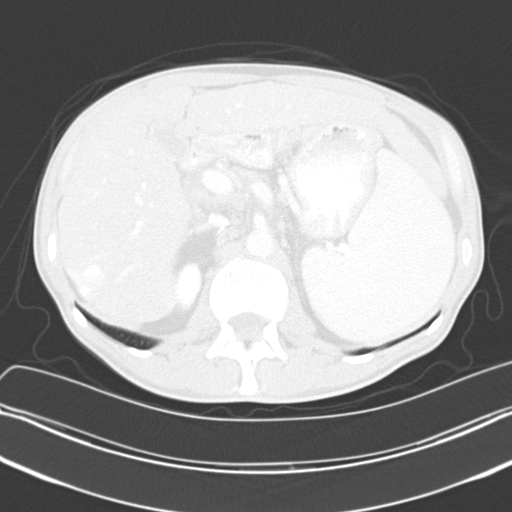
[im 58/81  soft-tissue]
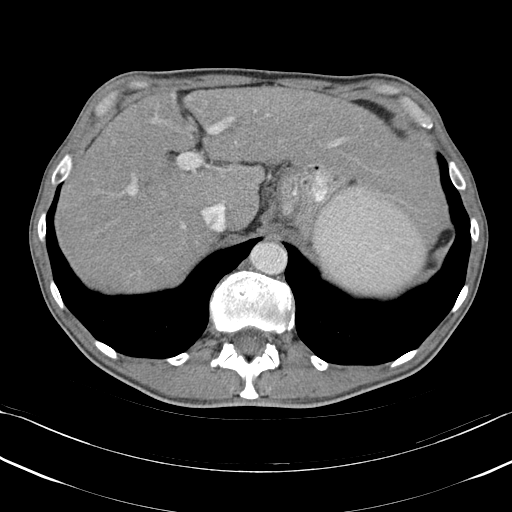
[im 58/81  lung]
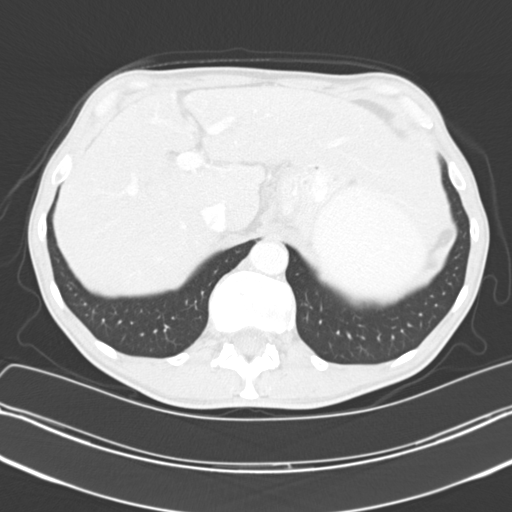
[im 69/81  soft-tissue]
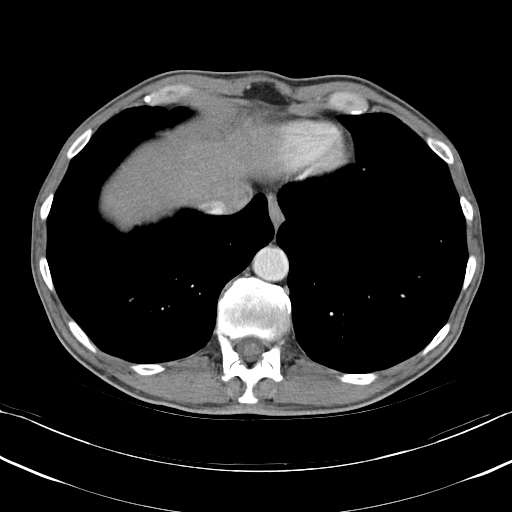
[im 69/81  lung]
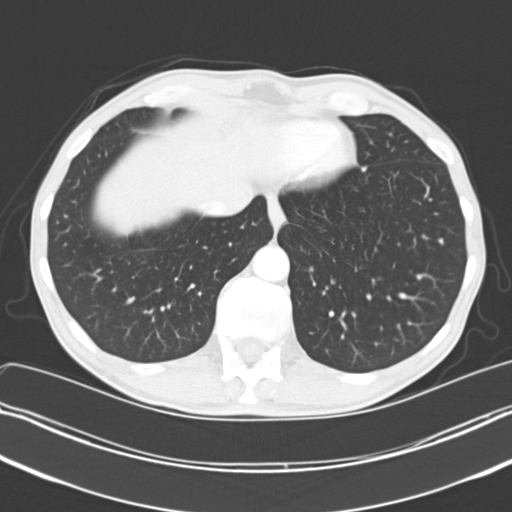

[12 of 46 positions shown; findings below may reference images not displayed]

FINDINGS: Lung bases clear.
Nodular appearing cirrhotic liver.
Two hypervascular lesions identified in right lobe of the liver,
2.3 x 2.1 x 2.0 cm laterally in inferior right lobe and 2.7 x 2.4 x
2.5 cm medially in inferior right lobe.
Lesions demonstrate hypervascularity on arterial phase imaging with
near complete washout on delayed images.
Enhancing pattern is highly suspicious for multi focal
hepatocellular carcinoma.
The more inferior medial lesion shows question of a tiny adjacent
satellite nodule more inferiorly.

Splenomegaly, spleen measuring 13.4 x 8.7 x 19.0 cm.
Several vague low attenuation foci are identified within spleen,
nonspecific, largest 13 mm diameter.
Left kidney displaced medially and anteriorly by splenic
enlargement.
No focal abnormalities identified within the kidneys, pancreas, or
adrenal glands.
Perigastric collaterals/varices noted.
Vascular structures patent.
Scattered atherosclerotic calcifications aorta and iliac arteries.
Bowel loops unremarkable.
No definite upper abdominal adenopathy or inflammatory process
seen.
No acute osseous findings.
IMPRESSION: Cirrhotic appearing liver containing too hypervascular foci in
right lobe with enhancement pattern highly suspicious for multi
focal hepatocellular carcinoma.
Significant splenomegaly with several nonspecific low attenuation
foci.
Perigastric collaterals.
Extensive atherosclerotic disease.

## 2014-02-07 ENCOUNTER — Emergency Department (HOSPITAL_COMMUNITY)
Admission: EM | Admit: 2014-02-07 | Discharge: 2014-02-23 | Disposition: E | Attending: Emergency Medicine | Admitting: Emergency Medicine

## 2014-02-07 ENCOUNTER — Encounter (HOSPITAL_COMMUNITY): Payer: Self-pay | Admitting: Emergency Medicine

## 2014-02-07 DIAGNOSIS — R4182 Altered mental status, unspecified: Secondary | ICD-10-CM | POA: Insufficient documentation

## 2014-02-07 DIAGNOSIS — F17200 Nicotine dependence, unspecified, uncomplicated: Secondary | ICD-10-CM | POA: Insufficient documentation

## 2014-02-07 DIAGNOSIS — Z79899 Other long term (current) drug therapy: Secondary | ICD-10-CM | POA: Insufficient documentation

## 2014-02-07 DIAGNOSIS — Z8739 Personal history of other diseases of the musculoskeletal system and connective tissue: Secondary | ICD-10-CM | POA: Insufficient documentation

## 2014-02-07 DIAGNOSIS — Z862 Personal history of diseases of the blood and blood-forming organs and certain disorders involving the immune mechanism: Secondary | ICD-10-CM | POA: Insufficient documentation

## 2014-02-07 DIAGNOSIS — C228 Malignant neoplasm of liver, primary, unspecified as to type: Secondary | ICD-10-CM | POA: Insufficient documentation

## 2014-02-07 DIAGNOSIS — F411 Generalized anxiety disorder: Secondary | ICD-10-CM | POA: Insufficient documentation

## 2014-02-07 DIAGNOSIS — Z9889 Other specified postprocedural states: Secondary | ICD-10-CM | POA: Insufficient documentation

## 2014-02-07 DIAGNOSIS — R0602 Shortness of breath: Secondary | ICD-10-CM | POA: Insufficient documentation

## 2014-02-07 DIAGNOSIS — I10 Essential (primary) hypertension: Secondary | ICD-10-CM | POA: Insufficient documentation

## 2014-02-07 DIAGNOSIS — Z8619 Personal history of other infectious and parasitic diseases: Secondary | ICD-10-CM | POA: Insufficient documentation

## 2014-02-07 DIAGNOSIS — Z8719 Personal history of other diseases of the digestive system: Secondary | ICD-10-CM | POA: Insufficient documentation

## 2014-02-07 DIAGNOSIS — C220 Liver cell carcinoma: Secondary | ICD-10-CM

## 2014-02-07 MED ORDER — SODIUM CHLORIDE 0.9 % IV SOLN: Freq: Once | INTRAVENOUS | Status: DC

## 2014-02-23 NOTE — ED Notes (Signed)
Time of death, pronounced by Dr. Roxanne Mins.

## 2014-02-23 NOTE — ED Notes (Signed)
While placing IV, patient began to have bradycardia and agonal respirations.  Dr. Roxanne Mins called to bedside.  O2 removed.  Family at bedside.

## 2014-02-23 NOTE — ED Provider Notes (Signed)
CSN: [id number]     Arrival date & time 2014/02/27  0157 History   First MD Initiated Contact with Patient 02/27/14 0243     Chief Complaint  Patient presents with  . Weakness  . Shortness of Breath     (Consider location/radiation/quality/duration/timing/severity/associated sxs/prior Treatment) Patient is a 58 y.o. male presenting with weakness and shortness of breath. The history is provided by the spouse. The history is limited by the condition of the patient (altered mental status).  Weakness Associated symptoms include shortness of breath.  Shortness of Breath He has a history of stage IV liver cancer and has under hospice care. Family states that over the last 12-24 hours, he has had decreased level of consciousness to the point where he is no longer talking and no longer eating. They've also noticed over the last one to 2 days that he has become very yellow. Family had not completed DO NOT RESUSCITATE paperwork but now stated that they wished him to be DO NOT RESUSCITATE.  Past Medical History  Diagnosis Date  . Liver lesion, right lobe 07/02/2011  . Cirrhosis of liver 07/02/2011    Finished Hep B Vaccines  . Low back pain     radiates to right side  . Thrombocytopenia   . Hepatitis C 07/02/2011    referred to Dr. Drucilla Schmidt for possible treatment of Hep C  . Anxiety   . Hypertension     per past medical records/faxed EKG to [id number] 10/31/11 with confirmation- old EKG from 2008 on chart  . Hepatocellular carcinoma 07/08/2011    referred to [facility] transplant clinic  . GERD (gastroesophageal reflux disease)    Past Surgical History  Procedure Laterality Date  . Liver biopsy  07/04/11    Done at [facility]  . Radiofrequency ablation liver tumor  November 04, 2011    Dr. Kathlene Cote   Family History  Problem Relation Age of Onset  . Cancer Father     lung  . Colon cancer Maternal Grandmother     Questionable, pt denies  . Wilson's disease Paternal Aunt   . Anesthesia problems Neg Hx   .  Hypotension Neg Hx   . Malignant hyperthermia Neg Hx   . Pseudochol deficiency Neg Hx    History  Substance Use Topics  . Smoking status: Current Every Day Smoker -- 0.25 packs/day for 40 years    Types: Cigarettes  . Smokeless tobacco: Never Used     Comment: 1/2 pack a day x 40 yrs.  . 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   Neurological: Positive for weakness.      Allergies  No known drug allergy  Home Medications   Current Outpatient Rx  Name  Route  Sig  Dispense  Refill  . ALPRAZolam (XANAX) 1 MG tablet   Oral   Take 1 mg by mouth 3 (three) times daily as needed. For anxiety         . lisinopril (PRINIVIL,ZESTRIL) 10 MG tablet   Oral   Take 10 mg by mouth daily.          . Multiple Vitamin (MULITIVITAMIN WITH MINERALS) TABS   Oral   Take 1 tablet by mouth daily.           . Oxycodone HCl 10 MG  TABS   Oral   Take 10 mg by mouth every 4 (four) hours as needed. For pain           BP 106/61  Pulse 65  Resp 14  Wt 105 lb (47.628 kg)  SpO2 98% Physical Exam  Nursing note and vitals reviewed.  58 year old male, resting comfortably and in no acute distress. Vital signs are normal. Oxygen saturation is 98%, which is normal. He is intensely jaundiced and has preretinal respirations. Head is normocephalic and atraumatic. PERRLA, EOMI. Oropharynx is clear. Eyes are somewhat sunken and sclerae are intensely icteric Neck is nontender and supple without adenopathy or JVD. Back is nontender and there is no CVA tenderness. Lungs are clear without rales, wheezes, or rhonchi. Chest is nontender. Heart has regular rate and rhythm without murmur. Abdomen is soft, flat, nontender. Liver is hard and nodular and enlarged.. Extremities have no cyanosis or edema, full range of motion is present. Skin is  warm and dry without rash. Neurologic: He is not responsive to pain or voice.  ED Course  Procedures (including critical care time)  MDM   Final diagnoses:  Hepatocellular carcinoma    Altered mental status in setting of known metastatic liver cancer. DO NOT RESUSCITATE paperwork is completed. His wife wants him to get medication for pain but she is reassured that he is not in pain currently. Screening labs are obtained. Old records are reviewed and he has a history of cirrhosis secondary to hepatitis C as well as hepatocellular cancer.  After patient was initially seen and evaluated, heart rate dropped and oxygen saturations dropped. Per family request, no attempts at resuscitation were initiated and the patient became asystolic and was pronounced dead at 3:06 AM. Family was present and was informed. Attempt was made to reach his PCP and was unsuccessful.  Delora Fuel, MD 67/34/19 [id number]

## 2014-02-23 NOTE — ED Notes (Signed)
Patient remains nonresponsive.  Wife at bedside.

## 2014-02-23 NOTE — ED Notes (Signed)
[id number]-spoke with Joice Lofts at [facility]; patient not suitable for donation.

## 2014-02-23 NOTE — ED Notes (Signed)
Wife states patient has not been able to take po since noon and his skin and sclera have gotten more orange since yesterday.

## 2014-02-23 NOTE — ED Notes (Signed)
Pt is Liver Cancer patient, supposed to be going to Hospice,  Has  Been staying at home with family.  Tonight, pt with decreased loc and generalized weakness, unable to take anything po including his pain meds.

## 2014-02-23 NOTE — ED Notes (Signed)
Staff members from [facility] came to pick up patient.

## 2014-02-23 DEATH — deceased
